# Patient Record
Sex: Male | Born: 1950 | Race: White | Hispanic: No | Marital: Married | Smoking: Never smoker
Health system: Southern US, Community
[De-identification: ages and names within clinical notes are randomized; demographics above are authoritative.]

## PROBLEM LIST (undated history)

## (undated) DIAGNOSIS — N138 Other obstructive and reflux uropathy: Secondary | ICD-10-CM

## (undated) DIAGNOSIS — I82409 Acute embolism and thrombosis of unspecified deep veins of unspecified lower extremity: Secondary | ICD-10-CM

## (undated) DIAGNOSIS — G2581 Restless legs syndrome: Secondary | ICD-10-CM

## (undated) DIAGNOSIS — K219 Gastro-esophageal reflux disease without esophagitis: Secondary | ICD-10-CM

## (undated) DIAGNOSIS — G4700 Insomnia, unspecified: Secondary | ICD-10-CM

## (undated) DIAGNOSIS — I10 Essential (primary) hypertension: Secondary | ICD-10-CM

## (undated) DIAGNOSIS — F419 Anxiety disorder, unspecified: Secondary | ICD-10-CM

## (undated) DIAGNOSIS — T8172XA Complication of vein following a procedure, not elsewhere classified, initial encounter: Secondary | ICD-10-CM

## (undated) DIAGNOSIS — E785 Hyperlipidemia, unspecified: Secondary | ICD-10-CM

## (undated) DIAGNOSIS — G4730 Sleep apnea, unspecified: Secondary | ICD-10-CM

## (undated) DIAGNOSIS — M1990 Unspecified osteoarthritis, unspecified site: Secondary | ICD-10-CM

## (undated) DIAGNOSIS — N189 Chronic kidney disease, unspecified: Secondary | ICD-10-CM

## (undated) DIAGNOSIS — N401 Enlarged prostate with lower urinary tract symptoms: Secondary | ICD-10-CM

## (undated) HISTORY — DX: Essential (primary) hypertension: I10

## (undated) HISTORY — DX: Hyperlipidemia, unspecified: E78.5

## (undated) HISTORY — PX: JOINT REPLACEMENT: SHX530

## (undated) HISTORY — PX: UMBILICAL HERNIA REPAIR: SHX196

## (undated) HISTORY — PX: CERVICAL FUSION: SHX112

---

## 1999-01-28 HISTORY — PX: HERNIA REPAIR: SHX51

## 2004-09-17 ENCOUNTER — Ambulatory Visit: Payer: Self-pay | Admitting: Gastroenterology

## 2005-08-20 ENCOUNTER — Ambulatory Visit: Payer: Self-pay | Admitting: Pain Medicine

## 2005-08-26 ENCOUNTER — Ambulatory Visit: Payer: Self-pay | Admitting: Pain Medicine

## 2005-09-11 ENCOUNTER — Ambulatory Visit: Payer: Self-pay | Admitting: Physician Assistant

## 2005-09-16 ENCOUNTER — Ambulatory Visit: Payer: Self-pay | Admitting: Pain Medicine

## 2005-10-13 ENCOUNTER — Ambulatory Visit: Payer: Self-pay | Admitting: Physician Assistant

## 2005-10-28 ENCOUNTER — Ambulatory Visit: Payer: Self-pay | Admitting: Physician Assistant

## 2006-05-25 ENCOUNTER — Ambulatory Visit: Payer: Self-pay | Admitting: Family Medicine

## 2006-05-27 ENCOUNTER — Ambulatory Visit: Payer: Self-pay | Admitting: Pain Medicine

## 2006-05-28 ENCOUNTER — Ambulatory Visit: Payer: Self-pay | Admitting: Pain Medicine

## 2006-06-04 ENCOUNTER — Ambulatory Visit: Payer: Self-pay | Admitting: Pain Medicine

## 2006-09-01 ENCOUNTER — Ambulatory Visit: Payer: Self-pay | Admitting: Neurosurgery

## 2008-06-20 ENCOUNTER — Ambulatory Visit: Payer: Self-pay | Admitting: Family Medicine

## 2009-05-29 ENCOUNTER — Ambulatory Visit: Payer: Self-pay | Admitting: Cardiovascular Disease

## 2009-05-29 DIAGNOSIS — R0602 Shortness of breath: Secondary | ICD-10-CM

## 2009-05-29 DIAGNOSIS — F341 Dysthymic disorder: Secondary | ICD-10-CM | POA: Insufficient documentation

## 2009-05-29 DIAGNOSIS — E785 Hyperlipidemia, unspecified: Secondary | ICD-10-CM

## 2010-02-27 NOTE — Progress Notes (Signed)
Summary: PHI  PHI   Imported By: Harlon Flor 05/31/2009 11:54:55  _____________________________________________________________________  External Attachment:    Type:   Image     Comment:   External Document

## 2010-02-27 NOTE — Assessment & Plan Note (Signed)
Summary: np6   Visit Type:  Initial Consult Referring Provider:  self referral Primary Provider:  Dr. Serita Grit  CC:  Patient have a history with chloresterol and never had a stress test. He is having problem with stamina and it is a concern to him. Patient visiting also to establish a new Cardiologist per his daughters that are nurses request.  History of Present Illness: Mr. Mom is a patient of Dr. 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 He states that over the past several months, he is noticed a significant decline in his ability to exert himself. He recalls several activity such as changing a tire and working on some heavy machinery when he needed help with his friends when typically he does not require any help. He describes his symptoms as her shortness of breath and difficulty getting a full breath in. He has occasional irregular heartbeats, some dizziness. His main complaint is the breathing and chest heaviness. He has been on several rounds of antibiotics for possible bronchitis and upper respiratory infection including sinusitis and ear infections. He states that the recent treatment with Levaquin seems to be helping a little bit.  he reports losing his wife last year to cancer and believes he has some depression. He feels a little better on Celexa.  he has been on a statin for 30 years.   Current Medications (verified): 1)  Etodolac 500 Mg Tabs (Etodolac) .... Take 1 Tablet By Mouth Once Daily 2)  Eq Acid Reducer 75 Mg Tabs (Ranitidine Hcl) .... Take 1 Tablet By Mouth Once Daily 3)  Lisinopril-Hydrochlorothiazide 20-25 Mg Tabs (Lisinopril-Hydrochlorothiazide) .... Take 1/2  Tablet By Mouth Once Daily 4)  Fosamax .4 Mg Tabs (Alendronate Sodium) .... Take 1 Tablet By Mouth Once Daily 5)   Simvastatin 60 Mg Tabs (Simvastatin) .... Take 1 Tablet By Mouth At Bedtime 6)  Zolpidem Tartrate 5 Mg Tabs (Zolpidem Tartrate) .... Take 1 Tablet By Mouth At Bedtime 7)  Citalopram Hydrobromide 20 Mg Tabs (Citalopram Hydrobromide) .... Take 1 Tablet By Mouth Once Daily 8)  Centrum Silver Ultra Mens  Tabs (Multiple Vitamins-Minerals) .... Take 1 Tablet By Mouth Once Daily 9)  Fish Oil 500 Mg Caps (Omega-3 Fatty Acids) .... Take 1  Tablet By Mouth Two Times A Day 10)  Zyrtec Allergy 10 Mg Tabs (Cetirizine Hcl) .... Take 1 Tablet By Mouth Once Daily As Needed 11)  [facility] Sinus & Allergy Max St 4-10 Mg Tabs (Chlorpheniramine-Phenylephrine) .... Take 1 Tablet By Mouth Once Daily As Needed  Allergies (verified): 1)  ! * Seasonal Allergies  Review of Systems       The patient complains of chest pain and dyspnea on exertion.  The patient denies fever, vision loss, decreased hearing, hoarseness, syncope, peripheral edema, prolonged cough, abdominal pain, incontinence, muscle weakness, depression, and enlarged lymph nodes.         Malaise  Vital Signs:  Patient profile:   60 year old male Height:      74 inches Weight:      235.50 pounds BMI:     30.35 Pulse rate:   73 / minute BP sitting:   116 / 68  (left arm) Cuff size:   large  Physical Exam  General:  male in no apparent distress, HEENT exam is benign, oropharynx is clear,  neck is supple with no JVP or carotid bruits, heart sounds are regular with S1-S2 and no murmurs appreciated, lungs are clear to auscultation with no wheezes Rales, abdominal exam is notable for severe obesity but otherwise benign, no significant lower extremity edema, neurologic exam is grossly nonfocal, skin is warm and dry. Pulses are equal and symmetrical in his upper and lower extremities.    EKG  Procedure date:  05/29/2009  Findings:      normal sinus rhythm with rate 73 beats per minute, right bundle branch block, no other significant ST or T wave  changes.  Impression & Recommendations:  Problem # 1:  CHEST TIGHTNESS-PRESSURE-OTHER ([id number]) Etiology of his chest discomfort, shortness of breath is uncertain. He is 60, has a right bundle branch block, no smoking history or diabetes history. He has no strong family history. He has been taking a cholesterol medication for 30 years. We have offered a stress test and  an echocardiogram to him though he would like to continue the Levaquin first, make sure his symptoms would not do to allergies, and possibly see a pulmonologist. I've asked him to contact us if his symptoms get worse as we would perform a stress test rule out ischemia as a cause of his shortness of breath, chest heaviness.

## 2010-03-20 ENCOUNTER — Ambulatory Visit (INDEPENDENT_AMBULATORY_CARE_PROVIDER_SITE_OTHER): Payer: BC Managed Care – PPO | Admitting: Cardiovascular Disease

## 2010-03-20 ENCOUNTER — Encounter: Payer: Self-pay | Admitting: Cardiovascular Disease

## 2010-03-20 DIAGNOSIS — E785 Hyperlipidemia, unspecified: Secondary | ICD-10-CM

## 2010-03-20 DIAGNOSIS — I10 Essential (primary) hypertension: Secondary | ICD-10-CM

## 2010-03-20 DIAGNOSIS — R079 Chest pain, unspecified: Secondary | ICD-10-CM

## 2010-03-20 DIAGNOSIS — R5381 Other malaise: Secondary | ICD-10-CM

## 2010-03-20 DIAGNOSIS — IMO0001 Reserved for inherently not codable concepts without codable children: Secondary | ICD-10-CM

## 2010-03-20 DIAGNOSIS — R5383 Other fatigue: Secondary | ICD-10-CM | POA: Insufficient documentation

## 2010-03-26 NOTE — Assessment & Plan Note (Signed)
Summary: ROV/BP elevated/AMD   Visit Type:  Follow-up Referring Provider:  self referral Primary Provider:  Dr. Serita Grit  CC:  c/o elevated blood pressure for the past month..  History of Present Illness: [name] is a patient of Dr. 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  He reports that he twisted his ankle in the past several months and because of that, has been less active. His weight has increased. With that, his pressure has increased. He was taking half a pill of lisinopril HCT and currently takes one full pill. He does report some systolic pressures greater than 140 periodically on average they are in the 130 range. Diastolic pressure typically in the 70s to 80s.  He does feel some fatigue, also some muscle discomfort. He has been building a cabin and has difficulty at times using a hammer. Otherwise sleeping well.  he reports losing his wife 2 years agor to cancer. he has been on a statin for 30 years.  EKG shows normal sinus rhythm with rate 86 beats per minute, right bundle branch block  Preventive Screening-Counseling & Management  Alcohol-Tobacco     Smoking Status: never  Caffeine-Diet-Exercise     Does Patient Exercise: yes  Current Medications (verified): 1)  Etodolac 500 Mg Tabs (Etodolac) .... Take 1 Tablet By Mouth Once Daily 2)  Eq Acid Reducer 75 Mg Tabs (Ranitidine Hcl) .... Take 1 Tablet By Mouth Once Daily 3)  Lisinopril-Hydrochlorothiazide 20-25 Mg Tabs (Lisinopril-Hydrochlorothiazide) .... Take 1  Tablet By Mouth Once Daily 4)  Fosamax .4 Mg Tabs (Alendronate Sodium) .... Take 1 Tablet By Mouth Once Daily 5)  Simvastatin 60 Mg Tabs (Simvastatin) .... Take 1 Tablet By Mouth At Bedtime 6)  Zolpidem Tartrate 5 Mg Tabs (Zolpidem Tartrate) .... Take 1 Tablet By Mouth At Bedtime 7)  Citalopram Hydrobromide 20 Mg Tabs (Citalopram Hydrobromide) .... Take 1 Tablet By Mouth Once  Daily 8)  Centrum Silver Ultra Mens  Tabs (Multiple Vitamins-Minerals) .... Take 1 Tablet By Mouth Once Daily 9)  Fish Oil 500 Mg Caps (Omega-3 Fatty Acids) .... Take 1  Tablet By Mouth Two Times A Day 10)  Zyrtec Allergy 10 Mg Tabs (Cetirizine Hcl) .... Take 1 Tablet By Mouth Once Daily As Needed 11)  Cvs Sinus & Allergy Max St 4-10 Mg Tabs (Chlorpheniramine-Phenylephrine) .... Take 1 Tablet By Mouth Once Daily As Needed  Allergies (verified): 1)  ! * Seasonal Allergies  Past History:  Family History: Last updated: 26-Mar-2010 Father: Deceased; lung disease Mother: Deceased; CHF  Social History: Last updated: 03/26/10 Retired  Widowed  Tobacco Use - No.  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  Walks 2-3 miles once daily  Risk Factors: Exercise: yes (March 26, 2010)  Risk Factors: Smoking Status: never (26-Mar-2010)  Past Medical History: Hyperlipidemia Hypertension  Past Surgical History: umbilical hernia repair  Family History: Father: Deceased; lung disease Mother: Deceased; CHF  Social History: Retired  Widowed  Tobacco Use - No.  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  fatigue, occasional muscle ache  Vital Signs:  Patient profile:   60 year old male Height:      74 inches Weight:      247 pounds BMI:     31.83 Pulse rate:   86 / minute BP sitting:   118 / 86  (left arm) Cuff size:   large  Vitals Entered By: Bishop Dublin, CMA (March 20, 2010 4:32 PM)  Physical Exam  General:  male in no apparent distress, HEENT exam is  benign, oropharynx is clear, neck is supple with no JVP or carotid bruits, heart sounds are regular with S1-S2 and no murmurs appreciated, lungs are clear to auscultation with no wheezes Rales, abdominal exam is notable for severe obesity but otherwise benign, no significant lower extremity edema, neurologic exam is grossly nonfocal, skin is warm and dry. Pulses are equal and symmetrical in his upper and lower extremities. Msk:  Back normal, normal gait. Muscle strength and tone normal. Neurologic:  Alert and oriented x 3. Skin:  Intact without lesions or rashes. Psych:  Normal affect.   Impression & Recommendations:  Problem # 1:  HYPERTENSION, BENIGN (ICD-401.1) we spent significant time talking about his blood pressure appeared his weight is elevated, I talked about salt and sodium, he is less active and more deconditioned because of his recent ankle injury. All of these issues could be combined for mildly elevated blood pressure. In general, it appears that it is reasonably well controlled on his current medication regimen and we have made no changes.  He did comment on having a cough and is concerned about lisinopril. We did mention that this could be changed to losartan. He would discuss this with Dr. Beckey Downing.  His updated medication list for this problem includes:    Lisinopril-hydrochlorothiazide 20-25 Mg Tabs (Lisinopril-hydrochlorothiazide) .Marland Kitchen... Take 1  tablet by mouth once daily  Problem # 2:  CHEST TIGHTNESS-PRESSURE-OTHER ([id number]) He does have occasional chest pressure that is very mild, or left side. It is somewhat atypical and otherwise he is very active. He would be low risk of underlying coronary artery disease as he has been on a statin for 30 years. We will continue to monitor him and if his symptoms persist or get worse with exertion, we could perform additional workup.  Problem # 3:  FATIGUE / MALAISE (ICD-780.79) he does have some fatigue, muscle ache and general  weakness. I think he is mildly deconditioned, has had recent weight change. I am concerned about his simvastatin and did suggest possibly he could take a statin holiday to see if his symptoms improve. If he does notice an improvement, he could be changed to Vytorin 10/20 mg daily or even Crestor 5 mg titrating up to 10 mg daily. We have samples of both if you would like to try this change.  If his symptoms persist, routine blood work could possibly include a thyroid check as well as free testosterone.  Problem # 4:  HYPERLIPIDEMIA-MIXED (ICD-272.4) he has no known coronary artery disease, some family history. He has been very aggressively treated with his cholesterol and I have complimented him on this. He is a nonsmoker. He is on aspirin.

## 2010-04-04 ENCOUNTER — Telehealth: Payer: Self-pay | Admitting: Cardiovascular Disease

## 2010-04-16 NOTE — Progress Notes (Signed)
Summary: Letter to PCD  Phone Note Call from Patient Call back at Home Phone [phone]   Caller: Self Call For: Roy Berger Summary of Call: Pt was in several weeks ago and was told by [facility] that we would send a letter to his PCD to discuss changing the pt from Simvastatin to Vytorin.  Pt walked in yesterday wanting to know the status of this.  Please advise. Initial call taken by: Harlon Flor,  April 04, 2010 8:04 AM  Follow-up for Phone Call        letter was written to Dr. Letta Kocher (last office note). He was going to take break from simva. If muscles better, would could start vytorin or crestor. Would he like [place] to make changes?     Appended Document: Letter to PCD Attempted to call pt, LMOM TCB /MES  Appended Document: Letter to PCD Spoke to pt, he states he is taking break from simva, he is going to follow up with Dr. Letta Kocher, and if he decides to make any changes, will do at this appt.

## 2010-08-15 ENCOUNTER — Encounter: Payer: Self-pay | Admitting: Cardiovascular Disease

## 2010-11-06 ENCOUNTER — Ambulatory Visit: Payer: Self-pay

## 2010-11-18 ENCOUNTER — Encounter: Payer: Self-pay | Admitting: Cardiovascular Disease

## 2010-11-25 ENCOUNTER — Encounter: Payer: Self-pay | Admitting: Cardiovascular Disease

## 2010-11-25 ENCOUNTER — Ambulatory Visit (INDEPENDENT_AMBULATORY_CARE_PROVIDER_SITE_OTHER): Payer: BC Managed Care – PPO | Admitting: Cardiovascular Disease

## 2010-11-25 VITALS — BP 120/84 | HR 88 | Ht 74.0 in | Wt 247.0 lb

## 2010-11-25 DIAGNOSIS — R0602 Shortness of breath: Secondary | ICD-10-CM

## 2010-11-25 DIAGNOSIS — R5381 Other malaise: Secondary | ICD-10-CM

## 2010-11-25 DIAGNOSIS — R5383 Other fatigue: Secondary | ICD-10-CM

## 2010-11-25 DIAGNOSIS — I10 Essential (primary) hypertension: Secondary | ICD-10-CM

## 2010-11-25 DIAGNOSIS — E785 Hyperlipidemia, unspecified: Secondary | ICD-10-CM

## 2010-11-25 MED ORDER — ALBUTEROL 90 MCG/ACT IN AERS: 2.0000 | INHALATION_SPRAY | Freq: Four times a day (QID) | RESPIRATORY_TRACT | Status: DC | PRN

## 2010-11-25 MED ORDER — FUROSEMIDE 20 MG PO TABS: 20.0000 mg | ORAL_TABLET | Freq: Two times a day (BID) | ORAL | Status: DC

## 2010-11-25 NOTE — Assessment & Plan Note (Signed)
Blood pressure is well controlled on today's visit. No changes made to the medications. 

## 2010-11-25 NOTE — Assessment & Plan Note (Signed)
Etiology of his shortness of breath is not exactly clear. 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 We have suggested he start to wear a mask. One option would be to try an albuterol inhaler to see if this helps. He reports that ulnar function tests have been discussed at some point in the past but have not been ordered yet.   His weight is up and his conditioning is down. This certainly could be contributing to his shortness of breath, particularly with climbing hills.He reported being short of breath on the treadmill, adding to the thought that some of his symptoms could be from conditioning.   The stress echo hopefully his definitive in ruling out ischemia though his symptoms get worse, options would include a Myoview or cardiac catheterization.  The echocardiogram did suggest elevated right-sided pressures, And mild pulmonary hypertension by the pressure measurements. He certainly may have diastolic dysfunction. We will give him a prescription for Lasix 20 mg to try as needed. We have talked to him about his diet and minimizing his fluid intake as well as salt intake.

## 2010-11-25 NOTE — Assessment & Plan Note (Signed)
We have suggested he stay on the Lipitor

## 2010-11-25 NOTE — Patient Instructions (Signed)
  Try furosemide /Lasix as needed for shortness of breath (with a banana) (diuretic), you can take up to two in the AM  Work on the weight and conditioning Try the inhaler for shortness of breath Monitor your heart rate with exertion.  If you continue to have worsening shortness of breath, call the office  Please call us if you have new issues that need to be addressed before your next appt.  The office will contact you for a follow up Appt. In 6 months

## 2010-11-25 NOTE — Progress Notes (Signed)
Patient ID: Roy Berger, male    DOB: 25-Jun-1950, 60 y.o.   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 He had a recent echocardiogram stress test. He exercised for less than 4 minutes, achieved an adequate heart rate with exaggerated blood pressure response to exercise with no significant EKG changes concerning for ischemia, normal LV function with no wall motion abnormalities with exertion, echocardiogram raising the concern of dilated right ventricle and elevated right ventricular systolic pressures (RVSP 43 mm Hg).  He continues to have symptoms of shortness of breath. He is uncertain if this is due to general deconditioning, sedentary lifestyle, weight gain, or something else.  He does eat and drink what he likes and his diet has not been very closely watched. He has cut his lisinopril HCT in half though sometimes does take the other half in the afternoon.   he reports losing his wife 2 1/2 years agor to cancer. he has been on a statin for 30 years.   Old EKG shows normal sinus rhythm with rate 86 beats per minute, right bundle branch block     Outpatient Encounter Prescriptions as of 11/25/2010  Medication Sig Dispense Refill  . Alendronate Sodium (FOSAMAX PO) Take 1 tablet by mouth daily. .4 mg       . atorvastatin (LIPITOR) 40 MG tablet Take 40 mg by mouth daily.        . Chlorpheniramine-Phenylephrine ([facility] SINUS & ALLERGY MAX ST) 4-10 MG per tablet Take 1 tablet by mouth daily as needed.        . citalopram (CELEXA) 20 MG tablet Take 20 mg by mouth daily.        Marland Kitchen lisinopril-hydrochlorothiazide (PRINZIDE,ZESTORETIC) 20-25 MG per tablet Take 1/2 tablet daily.      . Multiple Vitamins-Minerals (CENTRUM SILVER ULTRA MENS) TABS Take 1 tablet by mouth daily.        . Omega-3 Fatty Acids (FISH OIL) 500 MG CAPS Take 1 capsule by mouth 2 (two) times daily.         Marland Kitchen omeprazole (PRILOSEC) 20 MG capsule Take 20 mg by mouth daily.        Marland Kitchen zolpidem (AMBIEN) 5 MG tablet Take 5 mg by mouth daily as needed.        . etodolac (LODINE) 500 MG tablet Take 500 mg by mouth daily.          Review of Systems  Constitutional: Negative.   HENT: Negative.   Eyes: Negative.   Respiratory: Positive for shortness of breath.   Cardiovascular: Negative.   Gastrointestinal: Negative.   Musculoskeletal: Negative.   Skin: Negative.   Neurological: Negative.   Hematological: Negative.   Psychiatric/Behavioral: Negative.   All other systems reviewed and are negative.    BP 120/84  Pulse 88  Ht 6\' 2"  (1.88 m)  Wt 247 lb (112.038 kg)  BMI 31.71 kg/m2   Physical Exam  Nursing note and vitals reviewed. Constitutional: He is oriented to person, place, and time. He appears well-developed and well-nourished.  HENT:  Head: Normocephalic.  Nose: Nose normal.  Mouth/Throat: Oropharynx is clear and moist.  Eyes: Conjunctivae are normal. Pupils are equal, round, and reactive to light.  Neck: Normal range of motion. Neck supple. No JVD present.  Cardiovascular: Normal rate, regular rhythm, S1 normal, S2 normal, normal heart sounds and intact  distal pulses.  Exam reveals no gallop and no friction rub.   No murmur heard. Pulmonary/Chest: Effort normal and breath sounds normal. No respiratory distress. He has no wheezes. He has no rales. He exhibits no tenderness.  Abdominal: Soft. Bowel sounds are normal. He exhibits no distension. There is no tenderness.  Musculoskeletal: Normal range of motion. He exhibits no edema and no tenderness.  Lymphadenopathy:    He has no cervical adenopathy.  Neurological: He is alert and oriented to person, place, and time. Coordination normal.  Skin: Skin is warm and dry. No rash noted. No erythema.  Psychiatric: He has a normal mood and affect. His behavior is normal. Judgment and thought content normal.           Assessment and  Plan

## 2010-11-25 NOTE — Assessment & Plan Note (Signed)
He does have fatigue. Recent diagnosis of obstructive sleep apnea. He is not done part 2 of the sleep study.

## 2010-12-11 ENCOUNTER — Ambulatory Visit: Payer: Self-pay | Admitting: Internal Medicine

## 2011-10-28 ENCOUNTER — Ambulatory Visit: Payer: Self-pay | Admitting: Neurological Surgery

## 2014-06-25 ENCOUNTER — Ambulatory Visit
Admission: EM | Admit: 2014-06-25 | Discharge: 2014-06-25 | Disposition: A | Discharge status: Home / Self Care | Payer: Medicare HMO | Attending: Family Medicine | Admitting: Family Medicine

## 2014-06-25 ENCOUNTER — Encounter: Payer: Self-pay | Admitting: *Deleted

## 2014-06-25 DIAGNOSIS — S0501XA Injury of conjunctiva and corneal abrasion without foreign body, right eye, initial encounter: Secondary | ICD-10-CM | POA: Diagnosis not present

## 2014-06-25 DIAGNOSIS — T1591XA Foreign body on external eye, part unspecified, right eye, initial encounter: Secondary | ICD-10-CM

## 2014-06-25 HISTORY — DX: Unspecified osteoarthritis, unspecified site: M19.90

## 2014-06-25 MED ORDER — MOXIFLOXACIN HCL 0.5 % OP SOLN: 1.0000 [drp] | Freq: Three times a day (TID) | OPHTHALMIC | Status: DC

## 2014-06-25 NOTE — ED Notes (Signed)
Working in yard Friday and noticed irritation in right eye.  Continues today feels like something is in his right eye.

## 2014-06-25 NOTE — ED Provider Notes (Signed)
CSN: [id number]     Arrival date & time 06/25/14  0906 History   First MD Initiated Contact with Patient 06/25/14 1026     Chief Complaint  Patient presents with  . Eye Pain   (Consider location/radiation/quality/duration/timing/severity/associated sxs/prior Treatment) HPI Comments: 64 yo with a complaint of "feeling like there's something under my right eyelid" since Friday. States had been working in the yard the day prior and not sure if he got something in the eye.   Patient is a 64 y.o. male presenting with eye pain and eye problem. The history is provided by the patient.  Eye Pain Pertinent negatives include no headaches.  Eye Problem Location:  R eye Quality:  Foreign body sensation Onset quality:  Sudden Duration:  3 days Timing:  Constant Progression:  Waxing and waning Chronicity:  New Context: foreign body   Foreign body:  Unknown Relieved by:  Commercial eye wash and eye drops (slightly) Associated symptoms: foreign body sensation and tearing   Associated symptoms: no blurred vision, no decreased vision, no discharge, no double vision, no headaches, no nausea, no numbness, no photophobia, no scotomas, no swelling, no tingling, no vomiting and no weakness     Past Medical History  Diagnosis Date  . Arthritis    Past Surgical History  Procedure Laterality Date  . Hernia repair     Family History  Problem Relation Age of Onset  . Rheum arthritis Sister    History  Substance Use Topics  . Smoking status: Never Smoker   . Smokeless tobacco: Never Used  . Alcohol Use: Yes    Review of Systems  Eyes: Positive for pain. Negative for blurred vision, double vision, photophobia and discharge.  Gastrointestinal: Negative for nausea and vomiting.  Neurological: Negative for tingling, weakness, numbness and headaches.    Allergies  Review of patient's allergies indicates no known allergies.  Home Medications   Prior to Admission medications   Medication Sig  Start Date End Date Taking? Authorizing Provider  atorvastatin (LIPITOR) 40 MG tablet Take 40 mg by mouth daily.   Yes Historical Provider, MD  citalopram (CELEXA) 20 MG tablet Take 20 mg by mouth daily.   Yes Historical Provider, MD  meloxicam (MOBIC) 15 MG tablet Take 15 mg by mouth daily.   Yes Historical Provider, MD  omeprazole (PRILOSEC) 20 MG capsule Take 20 mg by mouth daily.   Yes Historical Provider, MD  tamsulosin (FLOMAX) 0.4 MG CAPS capsule Take 0.4 mg by mouth.   Yes Historical Provider, MD  moxifloxacin (VIGAMOX) 0.5 % ophthalmic solution Place 1 drop into the right eye 3 (three) times daily. 06/25/14   Norval Gable, MD   Ht 6\' 2"  (1.88 m)  Wt 250 lb (113.399 kg)  BMI 32.08 kg/m2 Physical Exam  Constitutional: He appears well-developed and well-nourished. No distress.  Eyes: EOM and lids are normal. Pupils are equal, round, and reactive to light. Lids are everted and swept, no foreign bodies found. Right conjunctiva is injected (slightly injected).  Slit lamp exam:      The right eye shows corneal abrasion, foreign body (pinpoint, speck) and fluorescein uptake.    Corneal abrasion noted at about 6 o'clock position outside of iris; and pinpoint foreign body speck noted at about the 3 o'clock position just outside the pupil area, but within the iris area (unable to remove with cotton tip swab wet with normal saline)  Skin: He is not diaphoretic.  Nursing note and vitals reviewed.   ED Course  Procedures (including critical care time) Labs Review Labs Reviewed - No data to display  Imaging Review No results found.   MDM   1. Corneal abrasion, right, initial encounter   2. Foreign body of right eye, initial encounter    Discharge Medication List as of 06/25/2014 10:51 AM    START taking these medications   Details  moxifloxacin (VIGAMOX) 0.5 % ophthalmic solution Place 1 drop into the right eye 3 (three) times daily., Starting 06/25/2014, Until Discontinued, Normal        Plan: 1. Diagnosis reviewed with patient 2. rx as per orders; risks, benefits, potential side effects reviewed with patient 3. Recommend follow up with ophthalmologist for further evaluation and management 4. F/u prn if symptoms worsen or don't improve    Norval Gable, MD 06/27/14 [phone]

## 2014-06-25 NOTE — Discharge Instructions (Signed)
Corneal Abrasion °The cornea is the clear covering at the front and center of the eye. When looking at the colored portion of the eye (iris), you are looking through the cornea. This very thin tissue is made up of many layers. The surface layer is a single layer of cells (corneal epithelium) and is one of the most sensitive tissues in the body. If a scratch or injury causes the corneal epithelium to come off, it is called a corneal abrasion. If the injury extends to the tissues below the epithelium, the condition is called a corneal ulcer. °CAUSES  °· Scratches. °· Trauma. °· Foreign body in the eye. °Some people have recurrences of abrasions in the area of the original injury even after it has healed (recurrent erosion syndrome). Recurrent erosion syndrome generally improves and goes away with time. °SYMPTOMS  °· Eye pain. °· Difficulty or inability to keep the injured eye open. °· The eye becomes very sensitive to light. °· Recurrent erosions tend to happen suddenly, first thing in the morning, usually after waking up and opening the eye. °DIAGNOSIS  °Your health care provider can diagnose a corneal abrasion during an eye exam. Dye is usually placed in the eye using a drop or a small paper strip moistened by your tears. When the eye is examined with a special light, the abrasion shows up clearly because of the dye. °TREATMENT  °· Small abrasions may be treated with antibiotic drops or ointment alone. °· A pressure patch may be put over the eye. If this is done, follow your doctor's instructions for when to remove the patch. Do not drive or use machines while the eye patch is on. Judging distances is hard to do with a patch on. °If the abrasion becomes infected and spreads to the deeper tissues of the cornea, a corneal ulcer can result. This is serious because it can cause corneal scarring. Corneal scars interfere with light passing through the cornea and cause a loss of vision in the involved eye. °HOME CARE  INSTRUCTIONS °· Use medicine or ointment as directed. Only take over-the-counter or prescription medicines for pain, discomfort, or fever as directed by your health care provider. °· Do not drive or operate machinery if your eye is patched. Your ability to judge distances is impaired. °· If your health care provider has given you a follow-up appointment, it is very important to keep that appointment. Not keeping the appointment could result in a severe eye infection or permanent loss of vision. If there is any problem keeping the appointment, let your health care provider know. °SEEK MEDICAL CARE IF:  °· You have pain, light sensitivity, and a scratchy feeling in one eye or both eyes. °· Your pressure patch keeps loosening up, and you can blink your eye under the patch after treatment. °· Any kind of discharge develops from the eye after treatment or if the lids stick together in the morning. °· You have the same symptoms in the morning as you did with the original abrasion days, weeks, or months after the abrasion healed. °MAKE SURE YOU:  °· Understand these instructions. °· Will watch your condition. °· Will get help right away if you are not doing well or get worse. °Document Released: 01/11/2000 Document Revised: 01/18/2013 Document Reviewed: 09/20/2012 °[employer]® Patient Information ©2015 [employer]. This information is not intended to replace advice given to you by your health care provider. Make sure you discuss any questions you have with your health care provider. ° °

## 2014-06-27 ENCOUNTER — Encounter: Payer: Self-pay | Admitting: Cardiovascular Disease

## 2014-07-07 ENCOUNTER — Encounter: Payer: Self-pay | Admitting: Gynecology

## 2014-07-07 ENCOUNTER — Ambulatory Visit
Admission: EM | Admit: 2014-07-07 | Discharge: 2014-07-07 | Disposition: A | Discharge status: Home / Self Care | Payer: Medicare HMO | Attending: Family Medicine | Admitting: Family Medicine

## 2014-07-07 DIAGNOSIS — W57XXXA Bitten or stung by nonvenomous insect and other nonvenomous arthropods, initial encounter: Secondary | ICD-10-CM | POA: Diagnosis not present

## 2014-07-07 DIAGNOSIS — L03114 Cellulitis of left upper limb: Secondary | ICD-10-CM

## 2014-07-07 DIAGNOSIS — S40862A Insect bite (nonvenomous) of left upper arm, initial encounter: Secondary | ICD-10-CM

## 2014-07-07 MED ORDER — CEPHALEXIN 500 MG PO CAPS: 500.0000 mg | ORAL_CAPSULE | Freq: Two times a day (BID) | ORAL | Status: DC

## 2014-07-07 MED ORDER — MUPIROCIN CALCIUM 2 % EX CREA: 1.0000 "application " | TOPICAL_CREAM | Freq: Three times a day (TID) | CUTANEOUS | Status: DC

## 2014-07-07 NOTE — ED Notes (Signed)
Patient c/o ? Insect bite x 3 days. Per pt. Redness at area and itching.

## 2014-07-07 NOTE — Discharge Instructions (Signed)

## 2014-07-07 NOTE — ED Provider Notes (Signed)
CSN: [id number]     Arrival date & time 07/07/14  1701 History   First MD Initiated Contact with Patient 07/07/14 1740     Chief Complaint  Patient presents with  . Insect Bite   (Consider location/radiation/quality/duration/timing/severity/associated sxs/prior Treatment) HPI  64 y o M noted irritation of left forearm yesterday , presumed to be an insect bite - this morning had a halo of pink skin surrounding it and 2 small white blisters centrally .- he decided to open blisters with a needle but nothing  released. Arm is increasing warmth and irritation and he reports in. Denies allergies. Denies any symptoms except local irritation. Insect not witnesses-he suspects "spider or wasp"- no tick noted Past Medical History  Diagnosis Date  . HTN (hypertension)   . HLD (hyperlipidemia)   . Arthritis    Past Surgical History  Procedure Laterality Date  . Umbilical hernia repair    . Hernia repair     Family History  Problem Relation Age of Onset  . Rheum arthritis Sister    History  Substance Use Topics  . Smoking status: Never Smoker   . Smokeless tobacco: Never Used     Comment: tobacco use - no  . Alcohol Use: Yes     Comment: rare    Review of Systems Review of 10 systems negative for acute change except as referenced in HPI  Allergies  Review of patient's allergies indicates no known allergies.  Home Medications   Prior to Admission medications   Medication Sig Start Date End Date Taking? Authorizing Provider  Alendronate Sodium (FOSAMAX PO) Take 1 tablet by mouth daily. .4 mg    Yes Historical Provider, MD  atorvastatin (LIPITOR) 40 MG tablet Take 40 mg by mouth daily.     Yes Historical Provider, MD  atorvastatin (LIPITOR) 40 MG tablet Take 40 mg by mouth daily.   Yes Historical Provider, MD  Chlorpheniramine-Phenylephrine ([facility]) 4-10 MG per tablet Take 1 tablet by mouth daily as needed.     Yes Historical Provider, MD  citalopram (CELEXA) 20 MG  tablet Take 20 mg by mouth daily.     Yes Historical Provider, MD  citalopram (CELEXA) 20 MG tablet Take 20 mg by mouth daily.   Yes Historical Provider, MD  etodolac (LODINE) 500 MG tablet Take 500 mg by mouth daily.     Yes Historical Provider, MD  furosemide (LASIX) 20 MG tablet Take 1 tablet (20 mg total) by mouth 2 (two) times daily. 11/25/10 07/07/14 Yes Minna Merritts, MD  lisinopril-hydrochlorothiazide (PRINZIDE,ZESTORETIC) 20-25 MG per tablet Take 1/2 tablet daily.   Yes Historical Provider, MD  meloxicam (MOBIC) 15 MG tablet Take 15 mg by mouth daily.   Yes Historical Provider, MD  moxifloxacin (VIGAMOX) 0.5 % ophthalmic solution Place 1 drop into the right eye 3 (three) times daily. 06/25/14  Yes Norval Gable, MD  Multiple Vitamins-Minerals (CENTRUM SILVER ULTRA MENS) TABS Take 1 tablet by mouth daily.     Yes Historical Provider, MD  Omega-3 Fatty Acids (FISH OIL) 500 MG CAPS Take 1 capsule by mouth 2 (two) times daily.     Yes Historical Provider, MD  omeprazole (PRILOSEC) 20 MG capsule Take 20 mg by mouth daily.     Yes Historical Provider, MD  omeprazole (PRILOSEC) 20 MG capsule Take 20 mg by mouth daily.   Yes Historical Provider, MD  tamsulosin (FLOMAX) 0.4 MG CAPS capsule Take 0.4 mg by mouth.   Yes Historical  Provider, MD  albuterol (PROVENTIL,VENTOLIN) 90 MCG/ACT inhaler Inhale 2 puffs into the lungs every 6 (six) hours as needed for wheezing. 11/25/10 11/20/11  Minna Merritts, MD  cephALEXin (KEFLEX) 500 MG capsule Take 1 capsule (500 mg total) by mouth 2 (two) times daily. 07/07/14   Jan Fireman, PA-C  mupirocin cream (BACTROBAN) 2 % Apply 1 application topically 3 (three) times daily. Please substitute ointment ! 07/07/14   Jan Fireman, PA-C  zolpidem (AMBIEN) 5 MG tablet Take 5 mg by mouth daily as needed.      Historical Provider, MD   BP 122/75 mmHg  Pulse 84  Temp(Src) 98.6 F (37 C) (Oral)  Ht 6\' 2"  (1.88 m)  Wt 250 lb (113.399 kg)  BMI 32.08 kg/m2  SpO2  95% Physical Exam Constitutional -alert and oriented,well appearing and in no acute distress Head-atraumatic Eyes- cconjugate gaze Nose- no congestion or rhinorrhea Mouth/throat- mucous membranes moist , Neck- supple  CV- regular rate, grossly normal heart sounds,  Resp-no distress, normal respiratory effort,clear to auscultation bilaterally GI- ,no distention GU- not examined  Left upper extremity with 14 x16 cm erythema and central 3-4 mm wound compatible with report of probable insect bite- good pulses , no finger swelling - skin edges marked. No other extremity tenderness nor edema,no joint effusion,  LEs WNL Neuro- normal speech and language, no gross focal neurological deficit appreciated, no gait instability Skin-warm,dry ,intact;extensive sunburn face and arms - reports wearing sunscreen  Psych-mood and affect grossly normal; speech and behavior grossly normal ED Course  Procedures (including critical care time) Labs Review Labs Reviewed - No data to display  Imaging Review No results found.   Area of inflammation 14 x 16 cm was skin marked for patient. To report back if extends further or if without improvement in 24-48 hours.     MDM   1. Insect bite of arm, left, initial encounter   2. Cellulitis of left upper extremity     Plan: 1.  diagnosis reviewed with patient 2. rx as per orders; risks, benefits, potential side effects reviewed with patient 3. Recommend supportive treatment with Zantac 150 mg  And Zyrtec 10 mg BID x 3-5 days 4. Ice paks with skin protection prn 5. F/u if symptoms worsen or don't improve  Discharge Medication List as of 07/07/2014  6:13 PM    START taking these medications   Details  cephALEXin (KEFLEX) 500 MG capsule Take 1 capsule (500 mg total) by mouth 2 (two) times daily., Starting 07/07/2014, Until Discontinued, Normal    mupirocin cream (BACTROBAN) 2 % Apply 1 application topically 3 (three) times daily. Please substitute ointment  !, Starting 07/07/2014, Until Discontinued, Normal          Jan Fireman, PA-C 07/07/14 1828

## 2014-10-17 ENCOUNTER — Other Ambulatory Visit: Payer: Self-pay | Admitting: Specialist

## 2014-10-17 DIAGNOSIS — S83232D Complex tear of medial meniscus, current injury, left knee, subsequent encounter: Secondary | ICD-10-CM

## 2014-10-26 ENCOUNTER — Ambulatory Visit
Admission: RE | Admit: 2014-10-26 | Discharge: 2014-10-26 | Disposition: A | Discharge status: Home / Self Care | Payer: Medicare HMO | Source: Ambulatory Visit | Attending: Specialist | Admitting: Specialist

## 2014-10-26 DIAGNOSIS — X58XXXD Exposure to other specified factors, subsequent encounter: Secondary | ICD-10-CM | POA: Insufficient documentation

## 2014-10-26 DIAGNOSIS — S83232D Complex tear of medial meniscus, current injury, left knee, subsequent encounter: Secondary | ICD-10-CM

## 2014-11-06 ENCOUNTER — Ambulatory Visit: Payer: Medicare HMO | Admitting: Cardiovascular Disease

## 2014-11-30 ENCOUNTER — Encounter: Payer: Self-pay | Admitting: Cardiovascular Disease

## 2014-11-30 ENCOUNTER — Encounter (INDEPENDENT_AMBULATORY_CARE_PROVIDER_SITE_OTHER): Payer: Self-pay

## 2014-11-30 ENCOUNTER — Ambulatory Visit (INDEPENDENT_AMBULATORY_CARE_PROVIDER_SITE_OTHER): Payer: Medicare HMO | Admitting: Cardiovascular Disease

## 2014-11-30 VITALS — BP 130/82 | HR 55 | Ht 74.0 in | Wt 254.8 lb

## 2014-11-30 DIAGNOSIS — Z23 Encounter for immunization: Secondary | ICD-10-CM

## 2014-11-30 DIAGNOSIS — R0789 Other chest pain: Secondary | ICD-10-CM | POA: Diagnosis not present

## 2014-11-30 DIAGNOSIS — E785 Hyperlipidemia, unspecified: Secondary | ICD-10-CM | POA: Diagnosis not present

## 2014-11-30 DIAGNOSIS — F341 Dysthymic disorder: Secondary | ICD-10-CM

## 2014-11-30 DIAGNOSIS — R5382 Chronic fatigue, unspecified: Secondary | ICD-10-CM

## 2014-11-30 DIAGNOSIS — I10 Essential (primary) hypertension: Secondary | ICD-10-CM | POA: Diagnosis not present

## 2014-11-30 DIAGNOSIS — R0602 Shortness of breath: Secondary | ICD-10-CM

## 2014-11-30 NOTE — Assessment & Plan Note (Signed)
Mild shortness of breath on exertion likely from obesity, deconditioning. Recent normal stress test with poor exercise tolerance

## 2014-11-30 NOTE — Assessment & Plan Note (Signed)
Chronic fatigue, again recommended walking program. Unable to exclude sleep disorder

## 2014-11-30 NOTE — Addendum Note (Signed)
Addended by: Minna Merritts on: 11/30/2014 09:13 PM   Modules accepted: Level of Service

## 2014-11-30 NOTE — Assessment & Plan Note (Signed)
Seems to have significant anxiety concerning the election, pushing up his blood pressure and heart rate Recommended a regular walking program

## 2014-11-30 NOTE — Patient Instructions (Signed)
You are doing well. No medication changes were made.  Please call us if you have new issues that need to be addressed before your next appt.  Your physician wants you to follow-up in: 12 months.  You will receive a reminder letter in the mail two months in advance. If you don't receive a letter, please call our office to schedule the follow-up appointment. 

## 2014-11-30 NOTE — Assessment & Plan Note (Signed)
Unclear if his having some myalgias but does report some weakness in his shoulders If needed, he could hold the Lipitor for several weeks to see if symptoms improve

## 2014-11-30 NOTE — Assessment & Plan Note (Signed)
Blood pressure seems to be much better on current medication Suspect some labile component, possibly from anxiety He reports significant stress concerning the election

## 2014-11-30 NOTE — Progress Notes (Signed)
Patient ID: Roy Berger, male    DOB: March 20, 1950, 64 y.o.   MRN: [id number]  HPI Comments: 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 He takes both in the morning Denies any orthopnea symptoms Heart rate also up and down He denies any significant chest pain concerning for angina He does have shortness of breath with heavy exertion  Recent echo stress test every 2016, only exercised for approximately 3 minutes with heart rate 144 bpm Was reportedly normal Weight has increased from 247 pounds up to 254 pounds since his last clinic visit  Prior lab work reviewed with him, total cholesterol 177, LDL 98  EKG on today's visit shows normal sinus rhythm with rate 55 bpm, right bundle branch block  Other past medical history Prior echocardiogram stress test. He exercised for less than 4 minutes, achieved an adequate heart rate with exaggerated blood pressure response to exercise with no significant EKG changes concerning for ischemia, normal LV function with no wall motion abnormalities with exertion, echocardiogram raising the concern of dilated right ventricle and elevated right ventricular systolic pressures (RVSP 43 mm Hg).  he reports losing his wife  several years ago to cancer. he has been on a statin for >30 years.    No Known Allergies  Current Outpatient Prescriptions on File Prior to Visit  Medication Sig Dispense Refill  . atorvastatin (LIPITOR) 40 MG tablet Take 40 mg by mouth daily.      . cephALEXin (KEFLEX) 500 MG capsule Take 1 capsule (500 mg total) by mouth 2 (two) times daily. 20 capsule  0  . citalopram (CELEXA) 20 MG tablet Take 20 mg by mouth daily.    . meloxicam (MOBIC) 15 MG tablet Take 15 mg by mouth daily.    . Multiple Vitamins-Minerals (CENTRUM SILVER ULTRA MENS) TABS Take 1 tablet by mouth daily.      Marland Kitchen omeprazole (PRILOSEC) 20 MG capsule Take 20 mg by mouth daily.      . tamsulosin (FLOMAX) 0.4 MG CAPS capsule Take 0.4 mg by mouth.    . zolpidem (AMBIEN) 5 MG tablet Take 5 mg by mouth daily as needed.       No current facility-administered medications on file prior to visit.    Past Medical History  Diagnosis Date  . HTN (hypertension)   . HLD (hyperlipidemia)   . Arthritis     Past Surgical History  Procedure Laterality Date  . Umbilical hernia repair    . Hernia repair      Social History  reports that he has never smoked. He has never used smokeless tobacco. He reports that he drinks alcohol. He reports that he does not use illicit drugs.  Family History family history includes Rheum arthritis in his sister.  Review of Systems  Constitutional: Positive for fatigue.  Respiratory: Positive for shortness of breath.   Cardiovascular: Negative.   Gastrointestinal: Negative.   Musculoskeletal: Negative.   Neurological: Negative.   Psychiatric/Behavioral: Negative.   All other systems reviewed and are negative.   BP 130/82 mmHg  Pulse 55  Ht 6\' 2"  (1.88 m)  Wt  254 lb 12 oz (115.554 kg)  BMI 32.69 kg/m2   Physical Exam  Constitutional: He is oriented to person, place, and time. He appears well-developed and well-nourished.  HENT:  Head: Normocephalic.  Nose: Nose normal.  Mouth/Throat: Oropharynx is clear and moist.  Eyes: Conjunctivae are normal. Pupils are equal, round, and reactive to light.  Neck: Normal range of motion. Neck supple. No JVD present.  Cardiovascular: Normal rate, regular rhythm, S1 normal, S2 normal, normal heart sounds and intact distal pulses.  Exam reveals no gallop and no friction rub.   No murmur  heard. Pulmonary/Chest: Effort normal and breath sounds normal. No respiratory distress. He has no wheezes. He has no rales. He exhibits no tenderness.  Abdominal: Soft. Bowel sounds are normal. He exhibits no distension. There is no tenderness.  Musculoskeletal: Normal range of motion. He exhibits no edema or tenderness.  Lymphadenopathy:    He has no cervical adenopathy.  Neurological: He is alert and oriented to person, place, and time. Coordination normal.  Skin: Skin is warm and dry. No rash noted. No erythema.  Psychiatric: He has a normal mood and affect. His behavior is normal. Judgment and thought content normal.      Assessment and Plan   Nursing note and vitals reviewed.

## 2015-04-06 ENCOUNTER — Encounter: Payer: Self-pay | Admitting: *Deleted

## 2015-04-09 ENCOUNTER — Ambulatory Visit: Payer: Medicare HMO | Admitting: Anesthesiology

## 2015-04-09 ENCOUNTER — Encounter
Admission: RE | Disposition: A | Discharge status: Home / Self Care | Payer: Self-pay | Source: Ambulatory Visit | Attending: Gastroenterology

## 2015-04-09 ENCOUNTER — Encounter: Payer: Self-pay | Admitting: *Deleted

## 2015-04-09 ENCOUNTER — Ambulatory Visit
Admission: RE | Admit: 2015-04-09 | Discharge: 2015-04-09 | Disposition: A | Discharge status: Home / Self Care | Payer: Medicare HMO | Source: Ambulatory Visit | Attending: Gastroenterology | Admitting: Gastroenterology

## 2015-04-09 DIAGNOSIS — Z7982 Long term (current) use of aspirin: Secondary | ICD-10-CM | POA: Insufficient documentation

## 2015-04-09 DIAGNOSIS — K219 Gastro-esophageal reflux disease without esophagitis: Secondary | ICD-10-CM | POA: Diagnosis not present

## 2015-04-09 DIAGNOSIS — M1990 Unspecified osteoarthritis, unspecified site: Secondary | ICD-10-CM | POA: Diagnosis not present

## 2015-04-09 DIAGNOSIS — N400 Enlarged prostate without lower urinary tract symptoms: Secondary | ICD-10-CM | POA: Diagnosis not present

## 2015-04-09 DIAGNOSIS — K644 Residual hemorrhoidal skin tags: Secondary | ICD-10-CM | POA: Diagnosis not present

## 2015-04-09 DIAGNOSIS — E785 Hyperlipidemia, unspecified: Secondary | ICD-10-CM | POA: Insufficient documentation

## 2015-04-09 DIAGNOSIS — F329 Major depressive disorder, single episode, unspecified: Secondary | ICD-10-CM | POA: Insufficient documentation

## 2015-04-09 DIAGNOSIS — G4730 Sleep apnea, unspecified: Secondary | ICD-10-CM | POA: Insufficient documentation

## 2015-04-09 DIAGNOSIS — I10 Essential (primary) hypertension: Secondary | ICD-10-CM | POA: Insufficient documentation

## 2015-04-09 DIAGNOSIS — D122 Benign neoplasm of ascending colon: Secondary | ICD-10-CM | POA: Insufficient documentation

## 2015-04-09 DIAGNOSIS — Z79899 Other long term (current) drug therapy: Secondary | ICD-10-CM | POA: Insufficient documentation

## 2015-04-09 DIAGNOSIS — Z1211 Encounter for screening for malignant neoplasm of colon: Secondary | ICD-10-CM | POA: Diagnosis present

## 2015-04-09 DIAGNOSIS — K5730 Diverticulosis of large intestine without perforation or abscess without bleeding: Secondary | ICD-10-CM | POA: Diagnosis not present

## 2015-04-09 HISTORY — DX: Benign prostatic hyperplasia with lower urinary tract symptoms: N40.1

## 2015-04-09 HISTORY — PX: COLONOSCOPY WITH PROPOFOL: SHX5780

## 2015-04-09 HISTORY — DX: Gastro-esophageal reflux disease without esophagitis: K21.9

## 2015-04-09 HISTORY — DX: Sleep apnea, unspecified: G47.30

## 2015-04-09 HISTORY — DX: Insomnia, unspecified: G47.00

## 2015-04-09 HISTORY — DX: Other obstructive and reflux uropathy: N13.8

## 2015-04-09 SURGERY — COLONOSCOPY WITH PROPOFOL
Anesthesia: General

## 2015-04-09 MED ORDER — SODIUM CHLORIDE 0.9 % IV SOLN
INTRAVENOUS | Status: DC
  Administered 2015-04-09: 09:00:00 via INTRAVENOUS

## 2015-04-09 MED ORDER — MIDAZOLAM HCL 5 MG/5ML IJ SOLN
INTRAMUSCULAR | Status: DC | PRN
  Administered 2015-04-09: 1 mg via INTRAVENOUS

## 2015-04-09 MED ORDER — EPINEPHRINE HCL 0.1 MG/ML IJ SOSY
PREFILLED_SYRINGE | INTRAMUSCULAR | Status: DC | PRN
  Administered 2015-04-09 (×4): 10 ug via INTRAVENOUS

## 2015-04-09 MED ORDER — PROPOFOL 10 MG/ML IV BOLUS
INTRAVENOUS | Status: DC | PRN
  Administered 2015-04-09: 100 mg via INTRAVENOUS

## 2015-04-09 MED ORDER — LIDOCAINE HCL (CARDIAC) 20 MG/ML IV SOLN
INTRAVENOUS | Status: DC | PRN
Start: 2015-04-09 — End: 2015-04-09
  Administered 2015-04-09: 100 mg via INTRAVENOUS

## 2015-04-09 MED ORDER — EPHEDRINE SULFATE 50 MG/ML IJ SOLN
INTRAMUSCULAR | Status: DC | PRN
  Administered 2015-04-09: 10 mg via INTRAVENOUS

## 2015-04-09 MED ORDER — PROPOFOL 500 MG/50ML IV EMUL
INTRAVENOUS | Status: DC | PRN
  Administered 2015-04-09: 140 ug/kg/min via INTRAVENOUS

## 2015-04-09 MED ORDER — FENTANYL CITRATE (PF) 100 MCG/2ML IJ SOLN
INTRAMUSCULAR | Status: DC | PRN
  Administered 2015-04-09: 50 ug via INTRAVENOUS

## 2015-04-09 NOTE — Transfer of Care (Signed)
Immediate Anesthesia Transfer of Care Note  Patient: Roy Berger  Procedure(s) Performed: Procedure(s): COLONOSCOPY WITH PROPOFOL (N/A)  Patient Location: PACU  Anesthesia Type:General  Level of Consciousness: awake and patient cooperative  Airway & Oxygen Therapy: Patient Spontanous Breathing and Patient connected to nasal cannula oxygen  Post-op Assessment: Report given to RN and Post -op Vital signs reviewed and stable  Post vital signs: Reviewed and stable  Last Vitals:  Filed Vitals:   04/09/15 1043 04/09/15 1044  BP: 91/49 91/49  Pulse: 57 62  Temp: 36 C 36 C  Resp: 19 16    Complications: No apparent anesthesia complications

## 2015-04-09 NOTE — H&P (Signed)
Primary Care Physician:  Adin Hector, MD  Pre-Procedure History & Physical: HPI:  Roy Berger is a 65 y.o. male is here for an colonoscopy.   Past Medical History  Diagnosis Date  . HTN (hypertension)   . HLD (hyperlipidemia)   . Arthritis   . GERD (gastroesophageal reflux disease)   . Insomnia   . Benign prostatic hyperplasia with urinary obstruction and other lower urinary tract symptoms   . Sleep apnea     Past Surgical History  Procedure Laterality Date  . Umbilical hernia repair    . Hernia repair      Prior to Admission medications   Medication Sig Start Date End Date Taking? Authorizing Provider  aspirin (ASPIRIN EC) 81 MG EC tablet Take 81 mg by mouth daily. Swallow whole.   Yes Historical Provider, MD  Multiple Vitamins-Minerals (CENTRUM SILVER ULTRA MENS) TABS Take 1 tablet by mouth daily.     Yes Historical Provider, MD  atorvastatin (LIPITOR) 40 MG tablet Take 40 mg by mouth daily.      Historical Provider, MD  cephALEXin (KEFLEX) 500 MG capsule Take 1 capsule (500 mg total) by mouth 2 (two) times daily. Patient not taking: Reported on 04/09/2015 07/07/14   Jan Fireman, PA-C  citalopram (CELEXA) 20 MG tablet Take 20 mg by mouth daily.    Historical Provider, MD  hydrochlorothiazide (HYDRODIURIL) 25 MG tablet Take 25 mg by mouth daily.    Historical Provider, MD  lisinopril (PRINIVIL,ZESTRIL) 40 MG tablet Take 40 mg by mouth daily.    Historical Provider, MD  meloxicam (MOBIC) 15 MG tablet Take 15 mg by mouth daily.    Historical Provider, MD  omeprazole (PRILOSEC) 20 MG capsule Take 20 mg by mouth daily.      Historical Provider, MD  tamsulosin (FLOMAX) 0.4 MG CAPS capsule Take 0.4 mg by mouth.    Historical Provider, MD  zolpidem (AMBIEN) 5 MG tablet Take 5 mg by mouth daily as needed.      Historical Provider, MD    Allergies as of 03/29/2015  . (No Known Allergies)    Family History  Problem Relation Age of Onset  . Rheum arthritis Sister      Social History   Social History  . Marital Status: Married    Spouse Name: N/A  . Number of Children: N/A  . Years of Education: N/A   Occupational History  . Not on file.   Social History Main Topics  . Smoking status: Never Smoker   . Smokeless tobacco: Never Used     Comment: tobacco use - no  . Alcohol Use: Yes     Comment: rare  . Drug Use: No  . Sexual Activity: Yes   Other Topics Concern  . 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 Neck:  Supple; no masses or thyromegaly. Lungs:  Clear throughout to auscultation.    Heart:  Regular rate and rhythm. Abdomen:  Soft, nontender and nondistended. Normal bowel sounds,  without guarding, and without rebound.   Neurologic:  Alert and  oriented x4;  grossly normal neurologically.  Impression/Plan: Roy Berger is here for an colonoscopy to be performed for screening  Risks, benefits, limitations, and alternatives regarding  colonoscopy have been reviewed with the patient.  Questions have been answered.  All parties agreeable.   Josefine Class, MD  04/09/2015, 10:08 AM

## 2015-04-09 NOTE — Op Note (Signed)
[facility] Gastroenterology Patient Name: Roy Berger Procedure Date: 04/09/2015 10:10 AM MRN: [id number] Account #: [id number] Date of Birth: [birth year] Admit Type: Outpatient Age: 65 Room: [facility] ENDO ROOM 1 Gender: Male Note Status: Finalized Procedure:            Colonoscopy Indications:          Screening for colorectal malignant neoplasm, Last                        colonoscopy: 2006 Patient Profile:      This is a 65 year old male. Providers:            Gerrit Heck. Rayann Heman, MD Referring MD:         Ramonita Lab, MD (Referring MD) Medicines:            Propofol per Anesthesia Complications:        No immediate complications. Procedure:            Pre-Anesthesia Assessment:                       - Prior to the procedure, a History and Physical was                        performed, and patient medications, allergies and                        sensitivities were reviewed. The patient's tolerance of                        previous anesthesia was reviewed.                       After obtaining informed consent, the colonoscope was                        passed under direct vision. Throughout the procedure,                        the patient's blood pressure, pulse, and oxygen                        saturations were monitored continuously. The                        Colonoscope was introduced through the anus and                        advanced to the the cecum, identified by appendiceal                        orifice and ileocecal valve. The colonoscopy was                        performed without difficulty. The patient tolerated the                        procedure well. The quality of the bowel preparation                        was good. Findings:      The perianal exam findings include  non-thrombosed external hemorrhoids.      A 4 mm polyp was found in the ascending colon. The polyp was sessile.       The polyp was removed with a cold snare. Resection and  retrieval were       complete.      A few small-mouthed diverticula were found in the sigmoid colon and       ascending colon.      The exam was otherwise without abnormality on direct and retroflexion       views. Impression:           - Non-thrombosed external hemorrhoids found on perianal                        exam.                       - One 4 mm polyp in the ascending colon, removed with a                        cold snare. Resected and retrieved.                       - Diverticulosis in the sigmoid colon and in the                        ascending colon.                       - The examination was otherwise normal on direct and                        retroflexion views. Recommendation:       - Observe patient in GI recovery unit.                       - Resume regular diet.                       - Continue present medications.                       - Await pathology results.                       - Repeat colonoscopy for surveillance based on                        pathology results.                       - Return to referring physician.                       - The findings and recommendations were discussed with                        the patient.                       - The findings and recommendations were discussed with                        the patient's family. Procedure Code(s):    ---  Professional ---                       [phone], Colonoscopy, flexible; with removal of tumor(s),                        polyp(s), or other lesion(s) by snare technique Diagnosis Code(s):    --- Professional ---                       Z12.11, Encounter for screening for malignant neoplasm                        of colon                       D12.2, Benign neoplasm of ascending colon                       K64.4, Residual hemorrhoidal skin tags                       K57.30, Diverticulosis of large intestine without                        perforation or abscess without bleeding CPT  copyright 2016 American Medical Association. All rights reserved. The codes documented in this report are preliminary and upon coder review may  be revised to meet current compliance requirements. Mellody Life, MD 04/09/2015 10:32:48 AM This report has been signed electronically. Number of Addenda: 0 Note Initiated On: 04/09/2015 10:10 AM Scope Withdrawal Time: 0 hours 10 minutes 30 seconds  Total Procedure Duration: 0 hours 12 minutes 31 seconds       [facility]

## 2015-04-09 NOTE — Discharge Instructions (Signed)

## 2015-04-09 NOTE — Anesthesia Preprocedure Evaluation (Signed)
Anesthesia Evaluation  Patient identified by MRN, date of birth, ID band Patient awake    Airway Mallampati: III       Dental  (+) Teeth Intact   Pulmonary sleep apnea ,    Pulmonary exam normal        Cardiovascular Exercise Tolerance: Good hypertension, Pt. on medications  Rhythm:Regular     Neuro/Psych Depression    GI/Hepatic Neg liver ROS, GERD  ,  Endo/Other  negative endocrine ROS  Renal/GU negative Renal ROS     Musculoskeletal   Abdominal Normal abdominal exam  (+)   Peds  Hematology negative hematology ROS (+)   Anesthesia Other Findings   Reproductive/Obstetrics                             Anesthesia Physical Anesthesia Plan  ASA: II  Anesthesia Plan: General   Post-op Pain Management:    Induction: Intravenous  Airway Management Planned: Natural Airway and Nasal Cannula  Additional Equipment:   Intra-op Plan:   Post-operative Plan:   Informed Consent: I have reviewed the patients History and Physical, chart, labs and discussed the procedure including the risks, benefits and alternatives for the proposed anesthesia with the patient or authorized representative who has indicated his/her understanding and acceptance.     Plan Discussed with: CRNA  Anesthesia Plan Comments:         Anesthesia Quick Evaluation

## 2015-04-09 NOTE — Anesthesia Postprocedure Evaluation (Signed)
Anesthesia Post Note  Patient: Roy Berger  Procedure(s) Performed: Procedure(s) (LRB): COLONOSCOPY WITH PROPOFOL (N/A)  Patient location during evaluation: PACU Anesthesia Type: General Level of consciousness: awake Pain management: satisfactory to patient Vital Signs Assessment: post-procedure vital signs reviewed and stable Respiratory status: respiratory function stable Cardiovascular status: stable Anesthetic complications: no    Last Vitals:  Filed Vitals:   04/09/15 1043 04/09/15 1044  BP: 91/49 91/49  Pulse: 57 62  Temp: 36 C 36 C  Resp: 19 16    Last Pain: There were no vitals filed for this visit.               VAN STAVEREN,Smaran Gaus

## 2015-04-10 ENCOUNTER — Encounter: Payer: Self-pay | Admitting: Gastroenterology

## 2015-04-10 LAB — SURGICAL PATHOLOGY

## 2015-09-19 ENCOUNTER — Encounter
Admission: RE | Admit: 2015-09-19 | Discharge: 2015-09-19 | Disposition: A | Discharge status: Home / Self Care | Payer: Medicare HMO | Source: Ambulatory Visit | Attending: Orthopedic Surgery | Admitting: Orthopedic Surgery

## 2015-09-19 DIAGNOSIS — Z01812 Encounter for preprocedural laboratory examination: Secondary | ICD-10-CM | POA: Insufficient documentation

## 2015-09-19 DIAGNOSIS — M1611 Unilateral primary osteoarthritis, right hip: Secondary | ICD-10-CM | POA: Insufficient documentation

## 2015-09-19 DIAGNOSIS — M25551 Pain in right hip: Secondary | ICD-10-CM | POA: Diagnosis not present

## 2015-09-19 HISTORY — DX: Restless legs syndrome: G25.81

## 2015-09-19 HISTORY — DX: Chronic kidney disease, unspecified: N18.9

## 2015-09-19 HISTORY — DX: Anxiety disorder, unspecified: F41.9

## 2015-09-19 LAB — URINALYSIS COMPLETE WITH MICROSCOPIC (ARMC ONLY)
Bacteria, UA: NONE SEEN
Glucose, UA: NEGATIVE mg/dL
HGB URINE DIPSTICK: NEGATIVE
Ketones, ur: NEGATIVE mg/dL
Leukocytes, UA: NEGATIVE
NITRITE: NEGATIVE
PROTEIN: NEGATIVE mg/dL
SPECIFIC GRAVITY, URINE: 1.021 (ref 1.005–1.030)
pH: 5 (ref 5.0–8.0)

## 2015-09-19 LAB — CBC
HEMATOCRIT: 42 % (ref 40.0–52.0)
HEMOGLOBIN: 14.8 g/dL (ref 13.0–18.0)
MCH: 33.2 pg (ref 26.0–34.0)
MCHC: 35.3 g/dL (ref 32.0–36.0)
MCV: 94.3 fL (ref 80.0–100.0)
Platelets: 193 10*3/uL (ref 150–440)
RBC: 4.46 MIL/uL (ref 4.40–5.90)
RDW: 13.6 % (ref 11.5–14.5)
WBC: 6.3 10*3/uL (ref 3.8–10.6)

## 2015-09-19 LAB — PROTIME-INR
INR: 0.96
Prothrombin Time: 12.8 seconds (ref 11.4–15.2)

## 2015-09-19 LAB — BASIC METABOLIC PANEL
Anion gap: 8 (ref 5–15)
BUN: 23 mg/dL — AB (ref 6–20)
CALCIUM: 9.1 mg/dL (ref 8.9–10.3)
CHLORIDE: 104 mmol/L (ref 101–111)
CO2: 26 mmol/L (ref 22–32)
CREATININE: 1 mg/dL (ref 0.61–1.24)
GFR calc Af Amer: >60 mL/min (ref >60)
GFR calc non Af Amer: >60 mL/min (ref >60)
GLUCOSE: 105 mg/dL — AB (ref 65–99)
Potassium: 4.4 mmol/L (ref 3.5–5.1)
Sodium: 138 mmol/L (ref 135–145)

## 2015-09-19 LAB — TYPE AND SCREEN
ABO/RH(D): A POS
ANTIBODY SCREEN: NEGATIVE

## 2015-09-19 LAB — SURGICAL PCR SCREEN
MRSA, PCR: NEGATIVE
Staphylococcus aureus: NEGATIVE

## 2015-09-19 LAB — SEDIMENTATION RATE: SED RATE: 6 mm/h (ref 0–20)

## 2015-09-19 LAB — APTT: aPTT: 29 seconds (ref 24–36)

## 2015-09-19 NOTE — Patient Instructions (Signed)
  Your procedure is scheduled FO:[id number] 5, 2017 (Tuesday) Report to Same Day Surgery 2nd floor Medical Mall To find out your arrival time please call [phone] between 1PM - 3PM on September 28, 2015 (Friday) Remember: Instructions that are not followed completely may result in serious medical risk, up to and including death, or upon the discretion of your surgeon and anesthesiologist your surgery may need to be rescheduled.    _x___ 1. Do not eat food or drink liquids after midnight. No gum chewing or hard candies.     _x__ 2. No Alcohol for 24 hours before or after surgery.   _x___3. No Smoking for 24 prior to surgery.   ____  4. Bring all medications with you on the day of surgery if instructed.    __x__ 5. Notify your doctor if there is any change in your medical condition     (cold, fever, infections).     Do not wear jewelry, make-up, hairpins, clips or nail polish.  Do not wear lotions, powders, or perfumes. You may wear deodorant.  Do not shave 48 hours prior to surgery. Men may shave face and neck.  Do not bring valuables to the hospital.    [facility] is not responsible for any belongings or valuables.               Contacts, dentures or bridgework may not be worn into surgery.  Leave your suitcase in the car. After surgery it may be brought to your room.  For patients admitted to the hospital, discharge time is determined by your treatment team.   Patients discharged the day of surgery will not be allowed to drive home.    Please read over the following fact sheets that you were given:   [facility] Preparing for Surgery and or MRSA Information   _x___ Take these medicines the morning of surgery with A SIP OF WATER:    1. Citalopram   2. Omeprazole (Omeprazole at bedtime on September 4)  3.    ____ Fleet Enema (as directed)   _x___ Use CHG Soap or sage wipes as directed on instruction sheet   ____ Use inhalers on the day of surgery and bring to hospital  day of surgery  ____ Stop metformin 2 days prior to surgery    ____ Take 1/2 of usual insulin dose the night before surgery and none on the morning of   Surgery.           _x___ Stop aspirin or coumadin, or plavix (STOP ASPIRIN TEN DAYS PRIOR TO SURGERY)  _x__ Stop Anti-inflammatories such as Advil, Aleve, Ibuprofen, Motrin, Naproxen,          Naprosyn, Goodies powders or aspirin products. Ok to take Tylenol. (STOP ETODOLAC TEN DAYS PRIOR TO SURGERY)   _x__ Stop supplements until after surgery.  (STOP OCUVITE NOW)  __x__ Bring C-Pap to the hospital.

## 2015-09-20 LAB — URINE CULTURE: Culture: NO GROWTH

## 2015-10-02 ENCOUNTER — Inpatient Hospital Stay
Admission: RE | Admit: 2015-10-02 | Discharge: 2015-10-05 | DRG: 470 | Disposition: A | Discharge status: Home Health | Payer: Medicare HMO | Source: Ambulatory Visit | Attending: Orthopedic Surgery | Admitting: Orthopedic Surgery

## 2015-10-02 ENCOUNTER — Encounter: Payer: Self-pay | Admitting: *Deleted

## 2015-10-02 ENCOUNTER — Inpatient Hospital Stay: Payer: Medicare HMO

## 2015-10-02 ENCOUNTER — Inpatient Hospital Stay: Payer: Medicare HMO | Admitting: Anesthesiology

## 2015-10-02 ENCOUNTER — Encounter
Admission: RE | Disposition: A | Discharge status: Home Health | Payer: Self-pay | Source: Ambulatory Visit | Attending: Orthopedic Surgery

## 2015-10-02 DIAGNOSIS — G2581 Restless legs syndrome: Secondary | ICD-10-CM | POA: Diagnosis present

## 2015-10-02 DIAGNOSIS — K219 Gastro-esophageal reflux disease without esophagitis: Secondary | ICD-10-CM | POA: Diagnosis present

## 2015-10-02 DIAGNOSIS — Z419 Encounter for procedure for purposes other than remedying health state, unspecified: Secondary | ICD-10-CM

## 2015-10-02 DIAGNOSIS — Z791 Long term (current) use of non-steroidal anti-inflammatories (NSAID): Secondary | ICD-10-CM

## 2015-10-02 DIAGNOSIS — Z79899 Other long term (current) drug therapy: Secondary | ICD-10-CM | POA: Diagnosis not present

## 2015-10-02 DIAGNOSIS — Z7982 Long term (current) use of aspirin: Secondary | ICD-10-CM | POA: Diagnosis not present

## 2015-10-02 DIAGNOSIS — I129 Hypertensive chronic kidney disease with stage 1 through stage 4 chronic kidney disease, or unspecified chronic kidney disease: Secondary | ICD-10-CM | POA: Diagnosis present

## 2015-10-02 DIAGNOSIS — N189 Chronic kidney disease, unspecified: Secondary | ICD-10-CM | POA: Diagnosis present

## 2015-10-02 DIAGNOSIS — D62 Acute posthemorrhagic anemia: Secondary | ICD-10-CM | POA: Diagnosis not present

## 2015-10-02 DIAGNOSIS — G4730 Sleep apnea, unspecified: Secondary | ICD-10-CM | POA: Diagnosis present

## 2015-10-02 DIAGNOSIS — M1611 Unilateral primary osteoarthritis, right hip: Principal | ICD-10-CM | POA: Diagnosis present

## 2015-10-02 DIAGNOSIS — Z8261 Family history of arthritis: Secondary | ICD-10-CM

## 2015-10-02 DIAGNOSIS — Z87442 Personal history of urinary calculi: Secondary | ICD-10-CM | POA: Diagnosis not present

## 2015-10-02 DIAGNOSIS — E785 Hyperlipidemia, unspecified: Secondary | ICD-10-CM | POA: Diagnosis present

## 2015-10-02 DIAGNOSIS — G8918 Other acute postprocedural pain: Secondary | ICD-10-CM

## 2015-10-02 HISTORY — PX: TOTAL HIP ARTHROPLASTY: SHX124

## 2015-10-02 LAB — CBC
HCT: 39 % — ABNORMAL LOW (ref 40.0–52.0)
HEMOGLOBIN: 13.2 g/dL (ref 13.0–18.0)
MCH: 32.2 pg (ref 26.0–34.0)
MCHC: 33.8 g/dL (ref 32.0–36.0)
MCV: 95.4 fL (ref 80.0–100.0)
Platelets: 228 10*3/uL (ref 150–440)
RBC: 4.09 MIL/uL — AB (ref 4.40–5.90)
RDW: 13.6 % (ref 11.5–14.5)
WBC: 14.4 10*3/uL — ABNORMAL HIGH (ref 3.8–10.6)

## 2015-10-02 LAB — CREATININE, SERUM
CREATININE: 1 mg/dL (ref 0.61–1.24)
GFR calc Af Amer: >60 mL/min (ref >60)

## 2015-10-02 SURGERY — ARTHROPLASTY, HIP, TOTAL, ANTERIOR APPROACH
Anesthesia: General | Laterality: Right | Wound class: Clean

## 2015-10-02 MED ORDER — BUPIVACAINE-EPINEPHRINE (PF) 0.25% -1:200000 IJ SOLN
INTRAMUSCULAR | Status: AC
  Filled 2015-10-02: qty 30

## 2015-10-02 MED ORDER — LACTATED RINGERS IV SOLN
INTRAVENOUS | Status: DC
  Administered 2015-10-02 (×3): via INTRAVENOUS

## 2015-10-02 MED ORDER — KETAMINE HCL 50 MG/ML IJ SOLN
INTRAMUSCULAR | Status: DC | PRN
  Administered 2015-10-02: 50 mg via INTRAMUSCULAR

## 2015-10-02 MED ORDER — CITALOPRAM HYDROBROMIDE 20 MG PO TABS
20.0000 mg | ORAL_TABLET | Freq: Every day | ORAL | Status: DC
  Administered 2015-10-03 – 2015-10-05 (×3): 20 mg via ORAL
  Filled 2015-10-02 (×3): qty 1

## 2015-10-02 MED ORDER — ACETAMINOPHEN 325 MG PO TABS: 650.0000 mg | ORAL_TABLET | Freq: Four times a day (QID) | ORAL | Status: DC | PRN

## 2015-10-02 MED ORDER — CEFAZOLIN SODIUM-DEXTROSE 2-4 GM/100ML-% IV SOLN
2.0000 g | Freq: Once | INTRAVENOUS | Status: AC
Start: 2015-10-02 — End: 2015-10-02
  Administered 2015-10-02: 2 g via INTRAVENOUS

## 2015-10-02 MED ORDER — METHOCARBAMOL 500 MG PO TABS: 500.0000 mg | ORAL_TABLET | Freq: Four times a day (QID) | ORAL | Status: DC | PRN

## 2015-10-02 MED ORDER — SODIUM CHLORIDE 0.9 % IV SOLN
INTRAVENOUS | Status: DC
  Administered 2015-10-02 (×2): via INTRAVENOUS

## 2015-10-02 MED ORDER — OCUVITE EYE HEALTH GUMMIES PO CHEW: CHEWABLE_TABLET | Freq: Every day | ORAL | Status: DC

## 2015-10-02 MED ORDER — TAMSULOSIN HCL 0.4 MG PO CAPS
0.4000 mg | ORAL_CAPSULE | Freq: Every day | ORAL | Status: DC
  Administered 2015-10-03 – 2015-10-05 (×3): 0.4 mg via ORAL
  Filled 2015-10-02 (×3): qty 1

## 2015-10-02 MED ORDER — ASPIRIN EC 81 MG PO TBEC
81.0000 mg | DELAYED_RELEASE_TABLET | Freq: Every day | ORAL | Status: DC
  Administered 2015-10-02 – 2015-10-05 (×4): 81 mg via ORAL
  Filled 2015-10-02 (×4): qty 1

## 2015-10-02 MED ORDER — DIPHENHYDRAMINE HCL 12.5 MG/5ML PO ELIX: 12.5000 mg | ORAL_SOLUTION | ORAL | Status: DC | PRN

## 2015-10-02 MED ORDER — BUPIVACAINE HCL (PF) 0.5 % IJ SOLN
INTRAMUSCULAR | Status: DC | PRN
  Administered 2015-10-02: 3 mL

## 2015-10-02 MED ORDER — PROPOFOL 500 MG/50ML IV EMUL
INTRAVENOUS | Status: DC | PRN
  Administered 2015-10-02: 75 ug/kg/min via INTRAVENOUS

## 2015-10-02 MED ORDER — PHENOL 1.4 % MT LIQD
1.0000 | OROMUCOSAL | Status: DC | PRN
  Filled 2015-10-02: qty 177

## 2015-10-02 MED ORDER — ALUM & MAG HYDROXIDE-SIMETH 200-200-20 MG/5ML PO SUSP: 30.0000 mL | ORAL | Status: DC | PRN

## 2015-10-02 MED ORDER — EPHEDRINE SULFATE 50 MG/ML IJ SOLN
INTRAMUSCULAR | Status: DC | PRN
  Administered 2015-10-02: 5 mg via INTRAVENOUS
  Administered 2015-10-02 (×3): 10 mg via INTRAVENOUS

## 2015-10-02 MED ORDER — FENTANYL CITRATE (PF) 100 MCG/2ML IJ SOLN: 25.0000 ug | INTRAMUSCULAR | Status: DC | PRN

## 2015-10-02 MED ORDER — ENOXAPARIN SODIUM 40 MG/0.4ML ~~LOC~~ SOLN
40.0000 mg | SUBCUTANEOUS | Status: DC
  Administered 2015-10-03 – 2015-10-05 (×3): 40 mg via SUBCUTANEOUS
  Filled 2015-10-02 (×3): qty 0.4

## 2015-10-02 MED ORDER — METOCLOPRAMIDE HCL 10 MG PO TABS: 5.0000 mg | ORAL_TABLET | Freq: Three times a day (TID) | ORAL | Status: DC | PRN

## 2015-10-02 MED ORDER — ONDANSETRON HCL 4 MG PO TABS: 4.0000 mg | ORAL_TABLET | Freq: Four times a day (QID) | ORAL | Status: DC | PRN

## 2015-10-02 MED ORDER — CEFAZOLIN SODIUM-DEXTROSE 2-4 GM/100ML-% IV SOLN
INTRAVENOUS | Status: AC
  Filled 2015-10-02: qty 100

## 2015-10-02 MED ORDER — ACETAMINOPHEN 650 MG RE SUPP: 650.0000 mg | Freq: Four times a day (QID) | RECTAL | Status: DC | PRN

## 2015-10-02 MED ORDER — MENTHOL 3 MG MT LOZG
1.0000 | LOZENGE | OROMUCOSAL | Status: DC | PRN
Start: 2015-10-02 — End: 2015-10-05
  Filled 2015-10-02: qty 9

## 2015-10-02 MED ORDER — LACTATED RINGERS IV BOLUS (SEPSIS)
500.0000 mL | Freq: Once | INTRAVENOUS | Status: AC
  Administered 2015-10-02: 500 mL via INTRAVENOUS

## 2015-10-02 MED ORDER — OXYCODONE HCL 5 MG PO TABS
5.0000 mg | ORAL_TABLET | ORAL | Status: DC | PRN
  Administered 2015-10-02 (×2): 5 mg via ORAL
  Administered 2015-10-03: 10 mg via ORAL
  Administered 2015-10-03 – 2015-10-05 (×11): 5 mg via ORAL
  Filled 2015-10-02: qty 2
  Filled 2015-10-02 (×2): qty 1
  Filled 2015-10-02: qty 2
  Filled 2015-10-02 (×13): qty 1

## 2015-10-02 MED ORDER — HYDROCHLOROTHIAZIDE 25 MG PO TABS
25.0000 mg | ORAL_TABLET | Freq: Every day | ORAL | Status: DC
  Administered 2015-10-03: 25 mg via ORAL
  Filled 2015-10-02 (×2): qty 1

## 2015-10-02 MED ORDER — MAGNESIUM CITRATE PO SOLN
1.0000 | Freq: Once | ORAL | Status: DC | PRN
Start: 2015-10-02 — End: 2015-10-05
  Filled 2015-10-02: qty 296

## 2015-10-02 MED ORDER — BUPIVACAINE-EPINEPHRINE 0.25% -1:200000 IJ SOLN
INTRAMUSCULAR | Status: DC | PRN
  Administered 2015-10-02: 30 mL

## 2015-10-02 MED ORDER — NEOMYCIN-POLYMYXIN B GU 40-200000 IR SOLN
Status: AC
  Filled 2015-10-02: qty 4

## 2015-10-02 MED ORDER — FENTANYL CITRATE (PF) 100 MCG/2ML IJ SOLN
INTRAMUSCULAR | Status: DC | PRN
  Administered 2015-10-02: 50 ug via INTRAVENOUS

## 2015-10-02 MED ORDER — ONDANSETRON HCL 4 MG/2ML IJ SOLN
4.0000 mg | Freq: Four times a day (QID) | INTRAMUSCULAR | Status: DC | PRN
  Administered 2015-10-02 (×2): 4 mg via INTRAVENOUS
  Filled 2015-10-02 (×2): qty 2

## 2015-10-02 MED ORDER — SODIUM CHLORIDE 0.9 % IV SOLN
INTRAVENOUS | Status: DC | PRN
  Administered 2015-10-02: 50 ug/min via INTRAVENOUS

## 2015-10-02 MED ORDER — NEOMYCIN-POLYMYXIN B GU 40-200000 IR SOLN
Status: DC | PRN
Start: 2015-10-02 — End: 2015-10-02
  Administered 2015-10-02: 4 mL

## 2015-10-02 MED ORDER — MAGNESIUM HYDROXIDE 400 MG/5ML PO SUSP
30.0000 mL | Freq: Every day | ORAL | Status: DC | PRN
  Administered 2015-10-03 – 2015-10-04 (×2): 30 mL via ORAL
  Filled 2015-10-02 (×2): qty 30

## 2015-10-02 MED ORDER — BISACODYL 10 MG RE SUPP
10.0000 mg | Freq: Every day | RECTAL | Status: DC | PRN
  Administered 2015-10-04: 10 mg via RECTAL
  Filled 2015-10-02: qty 1

## 2015-10-02 MED ORDER — LISINOPRIL-HYDROCHLOROTHIAZIDE 20-25 MG PO TABS: 1.0000 | ORAL_TABLET | Freq: Every day | ORAL | Status: DC

## 2015-10-02 MED ORDER — ATORVASTATIN CALCIUM 20 MG PO TABS
40.0000 mg | ORAL_TABLET | ORAL | Status: DC
  Administered 2015-10-03 – 2015-10-05 (×2): 40 mg via ORAL
  Filled 2015-10-02 (×2): qty 2

## 2015-10-02 MED ORDER — PANTOPRAZOLE SODIUM 40 MG PO TBEC
80.0000 mg | DELAYED_RELEASE_TABLET | Freq: Every day | ORAL | Status: DC
  Administered 2015-10-03 – 2015-10-05 (×3): 80 mg via ORAL
  Filled 2015-10-02 (×3): qty 2

## 2015-10-02 MED ORDER — PROMETHAZINE HCL 25 MG/ML IJ SOLN
12.5000 mg | INTRAMUSCULAR | Status: AC
  Administered 2015-10-02: 12.5 mg via INTRAMUSCULAR
  Filled 2015-10-02: qty 1

## 2015-10-02 MED ORDER — PROMETHAZINE HCL 25 MG/ML IJ SOLN: 12.5000 mg | Freq: Four times a day (QID) | INTRAMUSCULAR | Status: DC | PRN

## 2015-10-02 MED ORDER — TRANEXAMIC ACID 1000 MG/10ML IV SOLN
1500.0000 mg | INTRAVENOUS | Status: AC
  Administered 2015-10-02: 1500 mg via INTRAVENOUS
  Filled 2015-10-02: qty 15

## 2015-10-02 MED ORDER — METHOCARBAMOL 1000 MG/10ML IJ SOLN
500.0000 mg | Freq: Four times a day (QID) | INTRAVENOUS | Status: DC | PRN
  Filled 2015-10-02: qty 5

## 2015-10-02 MED ORDER — PHENYLEPHRINE HCL 10 MG/ML IJ SOLN
INTRAMUSCULAR | Status: DC | PRN
  Administered 2015-10-02 (×4): 100 ug via INTRAVENOUS

## 2015-10-02 MED ORDER — ONDANSETRON HCL 4 MG/2ML IJ SOLN: 4.0000 mg | Freq: Once | INTRAMUSCULAR | Status: DC | PRN

## 2015-10-02 MED ORDER — DOCUSATE SODIUM 100 MG PO CAPS
100.0000 mg | ORAL_CAPSULE | Freq: Two times a day (BID) | ORAL | Status: DC
Start: 2015-10-02 — End: 2015-10-05
  Administered 2015-10-02 – 2015-10-05 (×7): 100 mg via ORAL
  Filled 2015-10-02 (×7): qty 1

## 2015-10-02 MED ORDER — ONDANSETRON HCL 4 MG/2ML IJ SOLN
INTRAMUSCULAR | Status: DC | PRN
  Administered 2015-10-02: 4 mg via INTRAVENOUS

## 2015-10-02 MED ORDER — METOCLOPRAMIDE HCL 5 MG/ML IJ SOLN: 5.0000 mg | Freq: Three times a day (TID) | INTRAMUSCULAR | Status: DC | PRN

## 2015-10-02 MED ORDER — LISINOPRIL 20 MG PO TABS
20.0000 mg | ORAL_TABLET | Freq: Every day | ORAL | Status: DC
  Administered 2015-10-03: 20 mg via ORAL
  Filled 2015-10-02 (×2): qty 1

## 2015-10-02 MED ORDER — ADULT MULTIVITAMIN W/MINERALS CH
ORAL_TABLET | Freq: Every day | ORAL | Status: DC
Start: 2015-10-02 — End: 2015-10-05
  Administered 2015-10-02 – 2015-10-05 (×4): 1 via ORAL
  Filled 2015-10-02 (×4): qty 1

## 2015-10-02 MED ORDER — ZOLPIDEM TARTRATE 5 MG PO TABS
5.0000 mg | ORAL_TABLET | Freq: Every evening | ORAL | Status: DC | PRN
  Administered 2015-10-02: 5 mg via ORAL
  Filled 2015-10-02: qty 1

## 2015-10-02 MED ORDER — MORPHINE SULFATE (PF) 2 MG/ML IV SOLN
2.0000 mg | INTRAVENOUS | Status: DC | PRN
  Administered 2015-10-02 (×2): 2 mg via INTRAVENOUS
  Filled 2015-10-02 (×2): qty 1

## 2015-10-02 MED ORDER — ACETAMINOPHEN 10 MG/ML IV SOLN
INTRAVENOUS | Status: AC
  Filled 2015-10-02: qty 100

## 2015-10-02 MED ORDER — CEFAZOLIN SODIUM-DEXTROSE 2-4 GM/100ML-% IV SOLN
2.0000 g | Freq: Four times a day (QID) | INTRAVENOUS | Status: AC
  Administered 2015-10-02 – 2015-10-03 (×3): 2 g via INTRAVENOUS
  Filled 2015-10-02 (×3): qty 100

## 2015-10-02 MED ORDER — ACETAMINOPHEN 10 MG/ML IV SOLN
INTRAVENOUS | Status: DC | PRN
  Administered 2015-10-02: 1000 mg via INTRAVENOUS

## 2015-10-02 MED ORDER — MIDAZOLAM HCL 5 MG/5ML IJ SOLN
INTRAMUSCULAR | Status: DC | PRN
  Administered 2015-10-02: 2 mg via INTRAVENOUS

## 2015-10-02 SURGICAL SUPPLY — 45 items
BLADE SAW SAG 18.5X105 (BLADE) ×3 IMPLANT
BNDG COHESIVE 6X5 TAN STRL LF (GAUZE/BANDAGES/DRESSINGS) ×6 IMPLANT
CANISTER SUCT 1200ML W/VALVE (MISCELLANEOUS) ×3 IMPLANT
CAPT HIP TOTAL 3 ×3 IMPLANT
CATH FOL LEG HOLDER (MISCELLANEOUS) ×3 IMPLANT
CATH TRAY METER 16FR LF (MISCELLANEOUS) ×3 IMPLANT
CHLORAPREP W/TINT 26ML (MISCELLANEOUS) ×3 IMPLANT
DRAPE C-ARM XRAY 36X54 (DRAPES) ×3 IMPLANT
DRAPE INCISE IOBAN 66X60 STRL (DRAPES) IMPLANT
DRAPE POUCH INSTRU U-SHP 10X18 (DRAPES) ×3 IMPLANT
DRAPE SHEET LG 3/4 BI-LAMINATE (DRAPES) ×9 IMPLANT
DRAPE STERI IOBAN 125X83 (DRAPES) ×3 IMPLANT
DRAPE TABLE BACK 80X90 (DRAPES) ×3 IMPLANT
DRSG OPSITE POSTOP 4X8 (GAUZE/BANDAGES/DRESSINGS) IMPLANT
ELECT BLADE 6.5 EXT (BLADE) ×3 IMPLANT
GAUZE SPONGE 4X4 12PLY STRL (GAUZE/BANDAGES/DRESSINGS) ×3 IMPLANT
GLOVE BIOGEL PI IND STRL 9 (GLOVE) ×1 IMPLANT
GLOVE BIOGEL PI INDICATOR 9 (GLOVE) ×2
GLOVE SURG ORTHO 9.0 STRL STRW (GLOVE) ×6 IMPLANT
GOWN SRG 2XL LVL 4 RGLN SLV (GOWNS) ×1 IMPLANT
GOWN STRL NON-REIN 2XL LVL4 (GOWNS) ×2
GOWN STRL REUS W/ TWL LRG LVL3 (GOWN DISPOSABLE) ×1 IMPLANT
GOWN STRL REUS W/TWL LRG LVL3 (GOWN DISPOSABLE) ×2
HEMOVAC 400CC 10FR (MISCELLANEOUS) IMPLANT
HOOD PEEL AWAY FLYTE STAYCOOL (MISCELLANEOUS) ×3 IMPLANT
MAT BLUE FLOOR 46X72 FLO (MISCELLANEOUS) ×3 IMPLANT
NDL SAFETY 18GX1.5 (NEEDLE) ×3 IMPLANT
NEEDLE SPNL 18GX3.5 QUINCKE PK (NEEDLE) ×3 IMPLANT
NS IRRIG 1000ML POUR BTL (IV SOLUTION) ×3 IMPLANT
PACK HIP COMPR (MISCELLANEOUS) ×3 IMPLANT
SOL PREP PVP 2OZ (MISCELLANEOUS) ×3
SOLUTION PREP PVP 2OZ (MISCELLANEOUS) ×1 IMPLANT
STAPLER SKIN PROX 35W (STAPLE) ×3 IMPLANT
STRAP SAFETY BODY (MISCELLANEOUS) ×6 IMPLANT
SUT DVC 2 QUILL PDO  T11 36X36 (SUTURE) ×2
SUT DVC 2 QUILL PDO T11 36X36 (SUTURE) ×1 IMPLANT
SUT DVC QUILL MONODERM 30X30 (SUTURE) ×3 IMPLANT
SUT SILK 0 (SUTURE) ×2
SUT SILK 0 30XBRD TIE 6 (SUTURE) ×1 IMPLANT
SUT VIC AB 1 CT1 36 (SUTURE) ×3 IMPLANT
SYR 20CC LL (SYRINGE) ×3 IMPLANT
SYR 30ML LL (SYRINGE) ×3 IMPLANT
TAPE MICROFOAM 4IN (TAPE) ×3 IMPLANT
TOWEL OR 17X26 4PK STRL BLUE (TOWEL DISPOSABLE) IMPLANT
TUBE KAMVAC SUCTION (TUBING) IMPLANT

## 2015-10-02 NOTE — NC FL2 (Signed)
[place] LEVEL OF CARE SCREENING TOOL     IDENTIFICATION  Patient Name: Roy Berger Birthdate: 09/20/1950 Sex: male Admission Date (Current Location): 10/02/2015  [place] and [place] Number:  Engineering geologist and Address:  [facility], [address] [place], [place] [address]      Provider Number: [id number]  Attending Physician Name and Address:  Hessie Knows, MD  Relative Name and Phone Number:       Current Level of Care: Hospital Recommended Level of Care: [place] Prior Approval Number:    Date Approved/Denied:   PASRR Number:   [id number]   Discharge Plan: SNF    Current Diagnoses: Patient Active Problem List   Diagnosis Date Noted  . Primary localized osteoarthritis of right hip 10/02/2015  . HYPERTENSION, BENIGN 03/20/2010  . Fatigue 03/20/2010  . Hyperlipidemia 05/29/2009  . ANXIETY DEPRESSION 05/29/2009  . DYSPNEA 05/29/2009    Orientation RESPIRATION BLADDER Height & Weight     Self, Time, Situation, Place  Normal Continent Weight: 242 lb (109.8 kg) Height:  6\' 2"  (188 cm)  BEHAVIORAL SYMPTOMS/MOOD NEUROLOGICAL BOWEL NUTRITION STATUS   (None.)  (None.) Continent Diet (Regular Diet. )  AMBULATORY STATUS COMMUNICATION OF NEEDS Skin   Extensive Assist Verbally Surgical wounds (Incision: Right Hip. )                       Personal Care Assistance Level of Assistance  Bathing, Feeding, Dressing Bathing Assistance: Limited assistance Feeding assistance: Independent Dressing Assistance: Limited assistance     Functional Limitations Info  Sight, Hearing, Speech Sight Info: Adequate Hearing Info: Adequate Speech Info: Adequate    SPECIAL CARE FACTORS FREQUENCY  PT (By licensed PT), OT (By licensed OT)     PT Frequency:  (5) OT Frequency:  (5)            Contractures      Additional Factors Info  Code Status, Allergies Code Status Info:  (Full Code. ) Allergies  Info:  (No Known Allergies. )           Current Medications (10/02/2015):  This is the current hospital active medication list Current Facility-Administered Medications  Medication Dose Route Frequency Provider Last Rate Last Dose  . 0.9 %  sodium chloride infusion   Intravenous Continuous Hessie Knows, MD 100 mL/hr at 10/02/15 1218    . acetaminophen (TYLENOL) tablet 650 mg  650 mg Oral Q6H PRN Hessie Knows, MD       Or  . acetaminophen (TYLENOL) suppository 650 mg  650 mg Rectal Q6H PRN Hessie Knows, MD      . alum & mag hydroxide-simeth (MAALOX/MYLANTA) 200-200-20 MG/5ML suspension 30 mL  30 mL Oral Q4H PRN Hessie Knows, MD      . aspirin EC tablet 81 mg  81 mg Oral Daily Hessie Knows, MD      . Derrill Memo ON 10/03/2015] atorvastatin (LIPITOR) tablet 40 mg  40 mg Oral Ceasar Lund, MD      . bisacodyl (DULCOLAX) suppository 10 mg  10 mg Rectal Daily PRN Hessie Knows, MD      . ceFAZolin (ANCEF) 2-4 GM/100ML-% IVPB           . ceFAZolin (ANCEF) IVPB 2g/100 mL premix  2 g Intravenous Q6H Hessie Knows, MD   2 g at 10/02/15 1327  . [START ON 10/03/2015] citalopram (CELEXA) tablet 20 mg  20 mg Oral Daily Hessie Knows, MD      .  diphenhydrAMINE (BENADRYL) 12.5 MG/5ML elixir 12.5-25 mg  12.5-25 mg Oral Q4H PRN Hessie Knows, MD      . docusate sodium (COLACE) capsule 100 mg  100 mg Oral BID Hessie Knows, MD   100 mg at 10/02/15 1218  . [START ON 10/03/2015] enoxaparin (LOVENOX) injection 40 mg  40 mg Subcutaneous Q24H Hessie Knows, MD      . Derrill Memo ON 10/03/2015] lisinopril (PRINIVIL,ZESTRIL) tablet 20 mg  20 mg Oral Daily Hessie Knows, MD       And  . Derrill Memo ON 10/03/2015] hydrochlorothiazide (HYDRODIURIL) tablet 25 mg  25 mg Oral Daily Hessie Knows, MD      . magnesium citrate solution 1 Bottle  1 Bottle Oral Once PRN Hessie Knows, MD      . magnesium hydroxide (MILK OF MAGNESIA) suspension 30 mL  30 mL Oral Daily PRN Hessie Knows, MD      . menthol-cetylpyridinium (CEPACOL) lozenge 3 mg  1 lozenge  Oral PRN Hessie Knows, MD       Or  . phenol (CHLORASEPTIC) mouth spray 1 spray  1 spray Mouth/Throat PRN Hessie Knows, MD      . methocarbamol (ROBAXIN) tablet 500 mg  500 mg Oral Q6H PRN Hessie Knows, MD       Or  . methocarbamol (ROBAXIN) 500 mg in dextrose 5 % 50 mL IVPB  500 mg Intravenous Q6H PRN Hessie Knows, MD      . metoCLOPramide (REGLAN) tablet 5-10 mg  5-10 mg Oral Q8H PRN Hessie Knows, MD       Or  . metoCLOPramide (REGLAN) injection 5-10 mg  5-10 mg Intravenous Q8H PRN Hessie Knows, MD      . morphine 2 MG/ML injection 2 mg  2 mg Intravenous Q1H PRN Hessie Knows, MD      . multivitamin with minerals tablet   Oral Daily Hessie Knows, MD   1 tablet at 10/02/15 1219  . ondansetron (ZOFRAN) tablet 4 mg  4 mg Oral Q6H PRN Hessie Knows, MD       Or  . ondansetron [facility]) injection 4 mg  4 mg Intravenous Q6H PRN Hessie Knows, MD   4 mg at 10/02/15 1220  . oxyCODONE (Oxy IR/ROXICODONE) immediate release tablet 5-10 mg  5-10 mg Oral Q3H PRN Hessie Knows, MD   5 mg at 10/02/15 1219  . [START ON 10/03/2015] pantoprazole (PROTONIX) EC tablet 80 mg  80 mg Oral Daily Hessie Knows, MD      . promethazine [facility]) injection 12.5 mg  12.5 mg Intramuscular STAT Hessie Knows, MD      . promethazine [facility]) injection 12.5 mg  12.5 mg Intravenous Q6H PRN Hessie Knows, MD      . Derrill Memo ON 10/03/2015] tamsulosin (FLOMAX) capsule 0.4 mg  0.4 mg Oral QPC breakfast Hessie Knows, MD      . zolpidem [facility]) tablet 5 mg  5 mg Oral QHS PRN Hessie Knows, MD         Discharge Medications: Please see discharge summary for a list of discharge medications.  Relevant Imaging Results:  Relevant Lab Results:   Additional Information  SSN: [id number]  Danie Chandler, [place] Work [phone]

## 2015-10-02 NOTE — Evaluation (Signed)
Physical Therapy Evaluation Patient Details Name: Roy Berger MRN: [id number] DOB: 10-07-1950 Today's Date: 10/02/2015   History of Present Illness  Pt admitted for R THR. Pt with history of HTN and anxiety.  Clinical Impression  Pt is a pleasant 65 year old male who was admitted for R THR. Pt performs bed mobility, transfers, and ambulation with cga and rw. Pt demonstrates deficits with strength/mobility/pain. Pt very motivated to perform therapy however had near syncopal episode upon transfer from bed->chair. Would benefit from skilled PT to address above deficits and promote optimal return to PLOF. Recommend transition to [place] upon discharge from acute hospitalization.       Follow Up Recommendations Home health PT    Equipment Recommendations       Recommendations for Other Services       Precautions / Restrictions Precautions Precautions: Anterior Hip;Fall Precaution Booklet Issued: No Restrictions Weight Bearing Restrictions: Yes RLE Weight Bearing: Weight bearing as tolerated      Mobility  Bed Mobility Overal bed mobility: Needs Assistance Bed Mobility: Supine to Sit     Supine to sit: Min guard     General bed mobility comments: bed mobility performed with cga. Safe technique performed. Once seated at EOB, pt able to sit with supervision  Transfers Overall transfer level: Needs assistance Equipment used: Rolling walker (2 wheeled) Transfers: Sit to/from Stand Sit to Stand: Min guard         General transfer comment: transfers performed with cga and rw. Once pt stands, pt becomes dizzy, however no syncope noted. RN noted  Ambulation/Gait Ambulation/Gait assistance: Min guard Ambulation Distance (Feet): 3 Feet Assistive device: Rolling walker (2 wheeled) Gait Pattern/deviations: Step-to pattern     General Gait Details: ambulated to recliner with safe technique and step to gait pattern. Verbal cues for sequencing  Stairs             Wheelchair Mobility    Modified Rankin (Stroke Patients Only)       Balance Overall balance assessment: Needs assistance Sitting-balance support: Feet supported Sitting balance-Leahy Scale: Good     Standing balance support: Bilateral upper extremity supported Standing balance-Leahy Scale: Good                               Pertinent Vitals/Pain Pain Assessment: 0-10 Pain Score: 0-No pain    Home Living Family/patient expects to be discharged to:: Private residence Living Arrangements: Spouse/significant other Available Help at Discharge: Family;Available 24 hours/day Type of Home: House Home Access: Stairs to enter Entrance Stairs-Rails: Left Entrance Stairs-Number of Steps: 5 Home Layout: One level Home Equipment: Walker - 2 wheels;Bedside commode (other adaptative equipment including gadgets)      Prior Function Level of Independence: Independent         Comments: recently had been using RW for 1 day     Hand Dominance        Extremity/Trunk Assessment   Upper Extremity Assessment: Overall WFL for tasks assessed           Lower Extremity Assessment: Generalized weakness (R LE grossly 3/5; L LE grossly 5/5)         Communication   Communication: No difficulties  Cognition Arousal/Alertness: Awake/alert Behavior During Therapy: WFL for tasks assessed/performed Overall Cognitive Status: Within Functional Limits for tasks assessed                      General  Comments      Exercises Other Exercises Other Exercises: Pt performed R LE ther-ex in supine including ankle pumps, quad sets, glut sets, and hip abd/add x 10 reps and cga. Safe technique performed      Assessment/Plan    PT Assessment Patient needs continued PT services  PT Diagnosis Difficulty walking;Acute pain;Generalized weakness   PT Problem List Decreased strength;Decreased activity tolerance;Decreased mobility;Decreased knowledge of use of  DME;Decreased safety awareness  PT Treatment Interventions Gait training;DME instruction;Therapeutic exercise   PT Goals (Current goals can be found in the Care Plan section) Acute Rehab PT Goals Patient Stated Goal: to go home PT Goal Formulation: With patient Time For Goal Achievement: 10/16/15 Potential to Achieve Goals: Good    Frequency BID   Barriers to discharge        Co-evaluation               End of Session Equipment Utilized During Treatment: Gait belt Activity Tolerance: Patient tolerated treatment well Patient left: in chair;with chair alarm set Nurse Communication: Mobility status         Time: [id number] PT Time Calculation (min) (ACUTE ONLY): 26 min   Charges:   PT Evaluation $PT Eval Moderate Complexity: 1 Procedure PT Treatments $Therapeutic Exercise: 8-22 mins   PT G Codes:        Robbye Dede 2015-10-14, 4:48 PM Greggory Stallion, PT, DPT [phone]

## 2015-10-02 NOTE — Care Management (Signed)
Patient admitted for hip replacement.  He and his wife says  they have all the equipment- walker and bedside commode.  No agency preference for home health - "as long as my insurance covers and they come to [facility].  [facility] is not able to provide OT.  [facility] is not in network with this particular [employer].  Referral call to [facility].  Called patient's enoxaparin 40 mg sq qd x 14 days to [facility] in [place].  Copay will be 47.00.

## 2015-10-02 NOTE — Anesthesia Procedure Notes (Signed)
Date/Time: 10/02/2015 7:54 AM Performed by: Nelda Marseille Pre-anesthesia Checklist: Patient identified, Emergency Drugs available, Suction available, Patient being monitored and Timeout performed Oxygen Delivery Method: Simple face mask

## 2015-10-02 NOTE — Anesthesia Postprocedure Evaluation (Addendum)
Anesthesia Post Note  Patient: Roy Berger  Procedure(s) Performed: Procedure(s) (LRB): TOTAL HIP ARTHROPLASTY ANTERIOR APPROACH (Right)  Patient location during evaluation: PACU Anesthesia Type: Spinal Level of consciousness: awake and alert Pain management: pain level controlled Vital Signs Assessment: post-procedure vital signs reviewed and stable Respiratory status: spontaneous breathing, nonlabored ventilation, respiratory function stable and patient connected to nasal cannula oxygen Cardiovascular status: blood pressure returned to baseline and stable Postop Assessment: no signs of nausea or vomiting Anesthetic complications: no    Last Vitals:  Vitals:   10/02/15 1621 10/02/15 1811  BP: 111/60 136/81  Pulse: 73 91  Resp: 18   Temp: 36.6 C     Last Pain:  Vitals:   10/02/15 1705  TempSrc:   PainSc: 0-No pain                 Molli Barrows

## 2015-10-02 NOTE — OR Nursing (Signed)
Sacral dressing, Ancef 2 gm, TXA 1.5gm sent to OR with patient To OR with thermal cap in place

## 2015-10-02 NOTE — Progress Notes (Signed)
Patient's daughter concerned that urine in foley bag is dark, and is requesting urinalysis. Dr Rudene Christians paged. Waiting on callback.

## 2015-10-02 NOTE — Anesthesia Preprocedure Evaluation (Signed)
Anesthesia Evaluation  Patient identified by MRN, date of birth, ID band Patient awake    Reviewed: Allergy & Precautions, H&P , NPO status , Patient's Chart, lab work & pertinent test results, reviewed documented beta blocker date and time   Airway Mallampati: II   Neck ROM: full    Dental  (+) Poor Dentition   Pulmonary neg pulmonary ROS, shortness of breath and with exertion, sleep apnea and Continuous Positive Airway Pressure Ventilation ,    Pulmonary exam normal        Cardiovascular hypertension, On Medications negative cardio ROS Normal cardiovascular exam Rhythm:regular Rate:Normal     Neuro/Psych PSYCHIATRIC DISORDERS negative neurological ROS  negative psych ROS   GI/Hepatic negative GI ROS, Neg liver ROS, GERD  Medicated,  Endo/Other  negative endocrine ROS  Renal/GU Renal diseasenegative Renal ROS  negative genitourinary   Musculoskeletal   Abdominal   Peds  Hematology negative hematology ROS (+)   Anesthesia Other Findings Past Medical History: No date: Anxiety No date: Arthritis No date: Benign prostatic hyperplasia with urinary obst* No date: Chronic kidney disease     Comment: Kidney stones in the past No date: GERD (gastroesophageal reflux disease) No date: HLD (hyperlipidemia) No date: HTN (hypertension) No date: Insomnia No date: Restless leg syndrome No date: Sleep apnea     Comment: Use C- PAP Past Surgical History: No date: CERVICAL FUSION     Comment: Anterior 04/09/2015: COLONOSCOPY WITH PROPOFOL N/A     Comment: Procedure: COLONOSCOPY WITH PROPOFOL;                Surgeon: Josefine Class, MD;  Location:               [facility] ENDOSCOPY;  Service: Endoscopy;                Laterality: N/A; No date: HERNIA REPAIR No date: UMBILICAL HERNIA REPAIR BMI    Body Mass Index:  31.07 kg/m     Reproductive/Obstetrics                             Anesthesia  Physical Anesthesia Plan  ASA: III  Anesthesia Plan: General and Spinal   Post-op Pain Management:    Induction:   Airway Management Planned:   Additional Equipment:   Intra-op Plan:   Post-operative Plan:   Informed Consent: I have reviewed the patients History and Physical, chart, labs and discussed the procedure including the risks, benefits and alternatives for the proposed anesthesia with the patient or authorized representative who has indicated his/her understanding and acceptance.   Dental Advisory Given  Plan Discussed with: CRNA  Anesthesia Plan Comments:         Anesthesia Quick Evaluation

## 2015-10-02 NOTE — H&P (Signed)
TOTAL HIP ADMISSION H&P  Patient is admitted for right total hip arthroplasty.  Subjective:  Chief Complaint: right hip pain  HPI: Roy Berger, 65 y.o. male, has a history of pain and functional disability in the right hip(s) due to arthritis and patient has failed non-surgical conservative treatments for greater than 12 weeks to include NSAID's and/or analgesics, flexibility and strengthening excercises and activity modification.  Onset of symptoms was gradual starting several years ago with gradually worsening course since that time.The patient noted no past surgery on the right hip(s).  Patient currently rates pain in the right hip at 8 out of 10 with activity. Patient has night pain, worsening of pain with activity and weight bearing, pain that interfers with activities of daily living and pain with passive range of motion. Patient has evidence of subchondral cysts, subchondral sclerosis, periarticular osteophytes, joint subluxation and joint space narrowing by imaging studies. This condition presents safety issues increasing the risk of falls.   There is no current active infection.  Patient Active Problem List   Diagnosis Date Noted  . HYPERTENSION, BENIGN 03/20/2010  . Fatigue 03/20/2010  . Hyperlipidemia 05/29/2009  . ANXIETY DEPRESSION 05/29/2009  . DYSPNEA 05/29/2009   Past Medical History:  Diagnosis Date  . Anxiety   . Arthritis   . Benign prostatic hyperplasia with urinary obstruction and other lower urinary tract symptoms   . Chronic kidney disease    Kidney stones in the past  . GERD (gastroesophageal reflux disease)   . HLD (hyperlipidemia)   . HTN (hypertension)   . Insomnia   . Restless leg syndrome   . Sleep apnea    Use C- PAP    Past Surgical History:  Procedure Laterality Date  . CERVICAL FUSION     Anterior  . COLONOSCOPY WITH PROPOFOL N/A 04/09/2015   Procedure: COLONOSCOPY WITH PROPOFOL;  Surgeon: Josefine Class, MD;  Location: [facility];   Service: Endoscopy;  Laterality: N/A;  . HERNIA REPAIR    . UMBILICAL HERNIA REPAIR      Prescriptions Prior to Admission  Medication Sig Dispense Refill Last Dose  . aspirin (ASPIRIN EC) 81 MG EC tablet Take 81 mg by mouth daily. Swallow whole.   "it's been more than 10 days"  . atorvastatin (LIPITOR) 40 MG tablet Take 40 mg by mouth every other day. bedtime   10/01/2015 at pm  . cephALEXin (KEFLEX) 500 MG capsule Take 1 capsule (500 mg total) by mouth 2 (two) times daily. 20 capsule 0 10/02/2015 at 0445  . citalopram (CELEXA) 20 MG tablet Take 20 mg by mouth daily.   10/02/2015 at 0445  . etodolac (LODINE) 500 MG tablet Take 500 mg by mouth 2 (two) times daily.   "10 or 12 days ago"  . lisinopril-hydrochlorothiazide (PRINZIDE,ZESTORETIC) 20-25 MG tablet Take 1 tablet by mouth daily.   10/02/2015 at 0445  . Multiple Vitamins-Minerals (CENTRUM SILVER ULTRA MENS) TABS Take 1 tablet by mouth daily.     "10-12 days ago"  . Multiple Vitamins-Minerals (OCUVITE EYE HEATLH GUMMIES PO) Take 1 capsule by mouth daily.   "10 or 12 days ago"Z  . omeprazole (PRILOSEC) 20 MG capsule Take 20 mg by mouth every other day.    10/02/2015 at 0445  . tamsulosin (FLOMAX) 0.4 MG CAPS capsule Take 0.4 mg by mouth daily after breakfast.    10/01/2015 at am  . zolpidem (AMBIEN) 5 MG tablet Take 5 mg by mouth daily as needed.  10/01/2015 at pm  . hydrochlorothiazide (HYDRODIURIL) 25 MG tablet Take 25 mg by mouth daily.   Not Taking  . lisinopril (PRINIVIL,ZESTRIL) 40 MG tablet Take by mouth daily. Now a combination drug with HCTZ   Not Taking  . meloxicam (MOBIC) 15 MG tablet Take 15 mg by mouth daily.   "10-12 days ago"   No Known Allergies  Social History  Substance Use Topics  . Smoking status: Never Smoker  . Smokeless tobacco: Never Used     Comment: tobacco use - no  . Alcohol use Yes     Comment: rare    Family History  Problem Relation Age of Onset  . Rheum arthritis Sister      ROS  Objective:  Physical  Exam  Vital signs in last 24 hours: Temp:  [97.9 F (36.6 C)] 97.9 F (36.6 C) (09/05 0603) Pulse Rate:  [70] 70 (09/05 0603) Resp:  [18] 18 (09/05 0603) BP: (130)/(79) 130/79 (09/05 0603) SpO2:  [97 %] 97 % (09/05 0603) Weight:  [109.8 kg (242 lb)] 109.8 kg (242 lb) (09/05 0603) Lungs clear Heart RRR HEENT normal 0 degrees internal rotation, 20 degrees external rotation right hip, no flexion contracture NV intact RLE Labs:   Estimated body mass index is 31.07 kg/m as calculated from the following:   Height as of this encounter: 6\' 2"  (1.88 m).   Weight as of this encounter: 109.8 kg (242 lb).   Imaging Review  AP and lateral x-rays of the right hip were ordered and personally reviewed. These show severe osteoarthritis with loss of superior and medial joint space.   There is a larger inferior osteophyte off the acetabulum with large osteophytes laterally and anteriorly around the femoral head.    Otherwise, this is a normal pelvis with mild central osteoarthritis of the left hip. There is no acute process.   X-ray Impression: Severe osteoarthritis with almost complete loss of joint space of right hip.  Assessment/Plan:  End stage arthritis, right hip(s)  The patient history, physical examination, clinical judgement of the provider and imaging studies are consistent with end stage degenerative joint disease of the right hip(s) and total hip arthroplasty is deemed medically necessary. The treatment options including medical management, injection therapy, arthroscopy and arthroplasty were discussed at length. The risks and benefits of total hip arthroplasty were presented and reviewed. The risks due to aseptic loosening, infection, stiffness, dislocation/subluxation,  thromboembolic complications and other imponderables were discussed.  The patient acknowledged the explanation, agreed to proceed with the plan and consent was signed. Patient is being admitted for inpatient  treatment for surgery, pain control, PT, OT, prophylactic antibiotics, VTE prophylaxis, progressive ambulation and ADL's and discharge planning.The patient is planning to be discharged home with home health services

## 2015-10-02 NOTE — Progress Notes (Signed)
Per Dr Rudene Christians, patient's urine is dark colored from bleeding from insertion of foley in OR. Patient and family updated. Will recheck urine tomorrow, if still discolored, will get UA per Dr Rudene Christians.

## 2015-10-02 NOTE — Op Note (Signed)
10/02/2015  9:47 AM  PATIENT:  Roy Berger  65 y.o. male  PRE-OPERATIVE DIAGNOSIS:  primary osteoarthritis right hip  POST-OPERATIVE DIAGNOSIS:  primary osteoarthritis right hip  PROCEDURE:  Procedure(s): TOTAL HIP ARTHROPLASTY ANTERIOR APPROACH (Right)  SURGEON: Laurene Footman, MD  ASSISTANTS: None  ANESTHESIA:   spinal  EBL:  Total I/O In: 1200 [I.V.:1200] Out: 900 [Urine:400; Blood:500]  BLOOD ADMINISTERED:none  DRAINS: none   LOCAL MEDICATIONS USED:  MARCAINE     SPECIMEN:  Source of Specimen:  Right femoral head  DISPOSITION OF SPECIMEN:  PATHOLOGY  COUNTS:  YES  TOURNIQUET:  * No tourniquets in log *  IMPLANTS: Medacta AMIS 5 lateralized stem with S 28 mm head, 58 mm Mpact cup DM with liner  DICTATION: .Dragon Dictation   The patient was brought to the operating room and after spinal anesthesia was obtained patient was placed on the operative table with the ipsilateral foot into the Medacta attachment, contralateral leg on a well-padded table. C-arm was brought in and preop template x-ray taken. After prepping and draping in usual sterile fashion appropriate patient identification and timeout procedures were completed. Anterior approach to the hip was obtained and centered over the greater trochanter and TFL muscle. The subcutaneous tissue was incised hemostasis being achieved by electrocautery. TFL fascia was incised and the muscle retracted laterally deep retractor placed. The lateral femoral circumflex vessels were identified and ligated. The anterior capsule was exposed and a capsulotomy performed. The neck was identified and a femoral neck cut carried out with a saw. The head was removed without difficulty and showed sclerotic femoral head and acetabulum. Reaming was carried out to 58 mm  and a 58 mm cup trial gave appropriate tightness to the acetabular component a 58 mm Mpact cup DM was impacted into position. The leg was then externally rotated and  ischiofemoral and pubofemoral releases carried out. The femur was sequentially broached to a size 5, size 5 stem with lateralized neck and S head trials were placed and the final components chosen. The 5 lateralized stem was inserted along with a S 28 mm head and 58 mm liner. The hip was reduced and was stable the wound was thoroughly irrigated.. The deep fascia was closed using a heavy Quill after infiltration of 30 cc of quarter percent Sensorcaine with epinephrine. 2-0 Quill to close the skin with skin staples. Xeroform and honeycomb dressing applied  PLAN OF CARE: Admit to inpatient

## 2015-10-02 NOTE — Transfer of Care (Signed)
Immediate Anesthesia Transfer of Care Note  Patient: Roy Berger  Procedure(s) Performed: Procedure(s): TOTAL HIP ARTHROPLASTY ANTERIOR APPROACH (Right)  Patient Location: PACU  Anesthesia Type:Spinal  Level of Consciousness: sedated  Airway & Oxygen Therapy: Patient Spontanous Breathing and Patient connected to face mask oxygen  Post-op Assessment: Report given to RN and Post -op Vital signs reviewed and stable  Post vital signs: Reviewed and stable  Last Vitals:  Vitals:   10/02/15 0603  BP: 130/79  Pulse: 70  Resp: 18  Temp: 36.6 C    Last Pain:  Vitals:   10/02/15 0603  TempSrc: Oral  PainSc: 4          Complications: No apparent anesthesia complications

## 2015-10-02 NOTE — Anesthesia Procedure Notes (Signed)
Spinal  Patient location during procedure: OR Start time: 10/02/2015 7:25 AM End time: 10/02/2015 7:30 AM Staffing Resident/CRNA: Nelda Marseille Performed: resident/CRNA  Preanesthetic Checklist Completed: patient identified, site marked, surgical consent, pre-op evaluation, timeout performed, IV checked, risks and benefits discussed and monitors and equipment checked Spinal Block Patient position: sitting Prep: Betadine Patient monitoring: heart rate, continuous pulse ox, blood pressure and cardiac monitor Approach: midline Location: L4-5 Injection technique: single-shot Needle Needle type: Whitacre and Introducer  Needle gauge: 24 G Needle length: 9 cm Assessment Sensory level: T10 Additional Notes Negative paresthesia. Negative blood return. Positive free-flowing CSF. Expiration date of kit checked and confirmed. Patient tolerated procedure well, without complications.

## 2015-10-03 ENCOUNTER — Encounter: Payer: Self-pay | Admitting: Orthopedic Surgery

## 2015-10-03 LAB — CBC
HCT: 35 % — ABNORMAL LOW (ref 40.0–52.0)
Hemoglobin: 12.5 g/dL — ABNORMAL LOW (ref 13.0–18.0)
MCH: 33.9 pg (ref 26.0–34.0)
MCHC: 35.8 g/dL (ref 32.0–36.0)
MCV: 94.7 fL (ref 80.0–100.0)
PLATELETS: 205 10*3/uL (ref 150–440)
RBC: 3.69 MIL/uL — ABNORMAL LOW (ref 4.40–5.90)
RDW: 13 % (ref 11.5–14.5)
WBC: 9.9 10*3/uL (ref 3.8–10.6)

## 2015-10-03 LAB — BASIC METABOLIC PANEL
Anion gap: 6 (ref 5–15)
BUN: 14 mg/dL (ref 6–20)
CALCIUM: 8.1 mg/dL — AB (ref 8.9–10.3)
CO2: 28 mmol/L (ref 22–32)
Chloride: 99 mmol/L — ABNORMAL LOW (ref 101–111)
Creatinine, Ser: 1.09 mg/dL (ref 0.61–1.24)
GFR calc Af Amer: >60 mL/min (ref >60)
GLUCOSE: 138 mg/dL — AB (ref 65–99)
Potassium: 3.5 mmol/L (ref 3.5–5.1)
Sodium: 133 mmol/L — ABNORMAL LOW (ref 135–145)

## 2015-10-03 NOTE — Progress Notes (Signed)
Physical Therapy Treatment Patient Details Name: Roy Berger MRN: [id number] DOB: Jul 06, 1950 Today's Date: 10/03/2015    History of Present Illness Pt. is admitted for a Right THR.    PT Comments    Pt in bed, agrees.  Participated in exercises as described below.  Transitioned to edge of bed (left to simulate home) with increased time and use of rail.  Stood and was able to ambulate 130' with walker and min guard.  No LOB and better step pattern this pm.  Pt does have 5 stairs with 1 rail or 4 steps with no rails to enter home.  Stairs deferred today due to general fatigue but will address tomorrow.  Pt sat up in chair x 1 hour this am.  Returned to chair after session this pm for lunch.   Follow Up Recommendations  Home health PT     Equipment Recommendations       Recommendations for Other Services       Precautions / Restrictions Precautions Precautions: Anterior Hip Precaution Booklet Issued: No Restrictions Weight Bearing Restrictions: Yes RLE Weight Bearing: Weight bearing as tolerated    Mobility  Bed Mobility Overal bed mobility: Needs Assistance Bed Mobility: Supine to Sit     Supine to sit: Min guard     General bed mobility comments: increased time but able to manage without assist  Transfers Overall transfer level: Needs assistance Equipment used: Rolling walker (2 wheeled) Transfers: Sit to/from Stand Sit to Stand: Min guard         General transfer comment: verbal cues for proper hand placements  Ambulation/Gait Ambulation/Gait assistance: Min guard Ambulation Distance (Feet): 130 Feet Assistive device: Rolling walker (2 wheeled) Gait Pattern/deviations: Step-to pattern   Gait velocity interpretation: Below normal speed for age/gender General Gait Details: better step pattern this pm   Stairs            Wheelchair Mobility    Modified Rankin (Stroke Patients Only)       Balance Overall balance assessment: Needs  assistance Sitting-balance support: Feet supported Sitting balance-Leahy Scale: Good     Standing balance support: Bilateral upper extremity supported Standing balance-Leahy Scale: Good                      Cognition Arousal/Alertness: Awake/alert Behavior During Therapy: WFL for tasks assessed/performed Overall Cognitive Status: Within Functional Limits for tasks assessed                      Exercises Other Exercises Other Exercises: Pt performed R LE ther-ex in supine including ankle pumps, quad sets, glut sets, and hip abd/add x 10 reps and cga. Safe technique performed    General Comments        Pertinent Vitals/Pain Pain Assessment: 0-10 Pain Score: 4  Pain Location: R hip Pain Descriptors / Indicators: Aching;Operative site guarding Pain Intervention(s): Limited activity within patient's tolerance    Home Living Family/patient expects to be discharged to:: Private residence Living Arrangements: Spouse/significant other Available Help at Discharge: Family;Available 24 hours/day Type of Home: House Home Access: Stairs to enter Entrance Stairs-Rails: Left Home Layout: One level Home Equipment: Walker - 2 wheels;Bedside commode;Cane - single point;Other (comment) (reachetr, and sockaid)      Prior Function Level of Independence: Independent          PT Goals (current goals can now be found in the care plan section) Acute Rehab PT Goals Patient Stated Goal: To retrun  home    Frequency  BID    PT Plan Current plan remains appropriate    Co-evaluation             End of Session Equipment Utilized During Treatment: Gait belt Activity Tolerance: Patient tolerated treatment well Patient left: in chair;with chair alarm set;with call bell/phone within reach;with family/visitor present     Time: [id number] PT Time Calculation (min) (ACUTE ONLY): 24 min  Charges:  $Gait Training: 8-22 mins $Therapeutic Exercise: 8-22 mins                     G Codes:      Chesley Noon, PTA 10/03/15, 12:22 PM

## 2015-10-03 NOTE — Progress Notes (Signed)
Physical Therapy Treatment Patient Details Name: Roy Berger MRN: [id number] DOB: 24-Jun-1950 Today's Date: 10/03/2015    History of Present Illness Pt admitted for R THR. Pt with history of HTN and anxiety.    PT Comments    PT in bed ready for session.  Participated in exercises as described below.  BP in supine 131/68. To edge of bed with increased time and rail with min guard.  He stood with min guard and verbal cues for correct hand placement.  He was able to ambulate 100' with walker and min guard with no loss of balance or complaints of dizziness.  He did have an irregular step pattern and required verbal cues to take smaller, regular steps.     Follow Up Recommendations  Home health PT     Equipment Recommendations       Recommendations for Other Services       Precautions / Restrictions Precautions Precautions: Anterior Hip;Fall Precaution Booklet Issued: No Restrictions Weight Bearing Restrictions: Yes RLE Weight Bearing: Weight bearing as tolerated    Mobility  Bed Mobility Overal bed mobility: Needs Assistance Bed Mobility: Supine to Sit     Supine to sit: Min guard     General bed mobility comments: bed mobility performed with cga. Safe technique performed. Once seated at EOB, pt able to sit with supervision  Transfers Overall transfer level: Needs assistance Equipment used: Rolling walker (2 wheeled) Transfers: Sit to/from Stand Sit to Stand: Min guard         General transfer comment: verbal cues for proper hand placements  Ambulation/Gait Ambulation/Gait assistance: Min guard Ambulation Distance (Feet): 100 Feet Assistive device: Rolling walker (2 wheeled) Gait Pattern/deviations: Step-to pattern (irregular pattern and length.  verbal cues to correct)   Gait velocity interpretation: Below normal speed for age/gender General Gait Details: no dizziness noted   Stairs            Wheelchair Mobility    Modified Rankin (Stroke  Patients Only)       Balance Overall balance assessment: Needs assistance Sitting-balance support: Feet supported Sitting balance-Leahy Scale: Good     Standing balance support: Bilateral upper extremity supported Standing balance-Leahy Scale: Good                      Cognition Arousal/Alertness: Awake/alert Behavior During Therapy: WFL for tasks assessed/performed Overall Cognitive Status: Within Functional Limits for tasks assessed                      Exercises Other Exercises Other Exercises: Pt performed R LE ther-ex in supine including ankle pumps, quad sets, glut sets, and hip abd/add x 10 reps and cga. Safe technique performed    General Comments        Pertinent Vitals/Pain Pain Assessment: 0-10 Pain Score: 5  Pain Location: R hip Pain Descriptors / Indicators: Aching;Constant    Home Living                      Prior Function            PT Goals (current goals can now be found in the care plan section)      Frequency  BID    PT Plan Current plan remains appropriate    Co-evaluation             End of Session Equipment Utilized During Treatment: Gait belt Activity Tolerance: Patient tolerated treatment well Patient  left: in chair;with chair alarm set;with call bell/phone within reach;with family/visitor present     Time: 0853-0919 PT Time Calculation (min) (ACUTE ONLY): 26 min  Charges:  $Gait Training: 8-22 mins $Therapeutic Exercise: 8-22 mins                    G Codes:      Chesley Noon, PTA 10/03/15, 10:19 AM

## 2015-10-03 NOTE — Evaluation (Signed)
Occupational Therapy Evaluation Patient Details Name: Roy Berger MRN: [id number] DOB: 1950/03/02 Today's Date: 10/03/2015    History of Present Illness Pt. is admitted for a Right THR.   Clinical Impression   Pt is 65 year old Male s/p right THR(Anterior Approach)  who lives at home with her husband. Pt was independent in all ADLs prior to surgery and is eager to return to PLOF.  Pt is currently limited in functional ADLs due to pain, decreased ROM, and decreased functional mobility during ADLs.  Pt. would benefit from continued skilled OT services for education in assistive devices, functional mobility, and education in recommendations for home modifications to increase safety and prevent falls.      Follow Up Recommendations  No OT follow up    Equipment Recommendations       Recommendations for Other Services PT consult     Precautions / Restrictions Precautions Precautions: Anterior Hip Precaution Booklet Issued: No Restrictions Weight Bearing Restrictions: Yes RLE Weight Bearing: Weight bearing as tolerated              ADL Overall ADL's : Needs assistance/impaired Eating/Feeding: Set up   Grooming: Set up               Lower Body Dressing: Minimal assistance               Functional mobility during ADLs: Min guard General ADL Comments: Pt. education was provided about A/E for LE dressing.     Vision     Perception     Praxis      Pertinent Vitals/Pain Pain Assessment: 0-10 Pain Score: 4  Pain Location: Right Hip Pain Descriptors / Indicators: Aching Pain Intervention(s): Limited activity within patient's tolerance     Hand Dominance Right   Extremity/Trunk Assessment Upper Extremity Assessment Upper Extremity Assessment: Overall WFL for tasks assessed           Communication Communication Communication: No difficulties   Cognition Arousal/Alertness: Awake/alert Behavior During Therapy: WFL for tasks  assessed/performed Overall Cognitive Status: Within Functional Limits for tasks assessed                     General Comments       Exercises   Other Exercises Other Exercises: Pt performed R LE ther-ex in supine including ankle pumps, quad sets, glut sets, and hip abd/add x 10 reps and cga. Safe technique performed   Shoulder Instructions      Home Living Family/patient expects to be discharged to:: Private residence Living Arrangements: Spouse/significant other Available Help at Discharge: Family;Available 24 hours/day Type of Home: House Home Access: Stairs to enter   Entrance Stairs-Rails: Left Home Layout: One level     Bathroom Shower/Tub: Tub/shower unit Shower/tub characteristics: Curtain Biochemist, clinical: Standard Bathroom Accessibility: Yes   Home Equipment: Environmental consultant - 2 wheels;Bedside commode;Cane - single point;Other (comment) (reachetr, and sockaid)          Prior Functioning/Environment Level of Independence: Independent             OT Diagnosis: Generalized weakness   OT Problem List: Decreased strength;Pain;Decreased safety awareness;Decreased knowledge of use of DME or AE   OT Treatment/Interventions: Self-care/ADL training;Therapeutic exercise;DME and/or AE instruction;Therapeutic activities;Patient/family education    OT Goals(Current goals can be found in the care plan section) Acute Rehab OT Goals Patient Stated Goal: To retrun home OT Goal Formulation: With patient Potential to Achieve Goals: Good  OT Frequency: Min 1X/week   Barriers  to D/C:            Co-evaluation              End of Session    Activity Tolerance: Patient tolerated treatment well Patient left: in bed   Time: 0938-1001 OT Time Calculation (min): 23 min Charges:  OT General Charges $OT Visit: 1 Procedure OT Evaluation $OT Eval Moderate Complexity: 1 Procedure G-Codes:    Harrel Carina, MS, OTR/L 10/03/2015, 11:00 AM

## 2015-10-03 NOTE — Progress Notes (Signed)
Clinical Social Worker (CSW) received SNF consult. PT is recommending home health. RN Case Manager is aware of above. Please reconsult if future social work needs arise. CSW signing off.   Edword Cu, LCSW [phone] 

## 2015-10-03 NOTE — Addendum Note (Signed)
Addendum  created 10/03/15 0726 by Nelda Marseille, CRNA   Sign clinical note

## 2015-10-03 NOTE — Progress Notes (Signed)
   Subjective: 1 Day Post-Op Procedure(s) (LRB): TOTAL HIP ARTHROPLASTY ANTERIOR APPROACH (Right) Patient reports pain as mild.   Patient is well, and has had no acute complaints or problems Denies any CP, SOB, ABD pain. We will continue therapy today.  Plan is to go Home after hospital stay.  Objective: Vital signs in last 24 hours: Temp:  [97.4 F (36.3 C)-100.2 F (37.9 C)] 99.5 F (37.5 C) (09/06 0328) Pulse Rate:  [51-91] 68 (09/06 0328) Resp:  [16-19] 19 (09/06 0328) BP: (75-136)/(47-81) 127/72 (09/06 0328) SpO2:  [92 %-100 %] 92 % (09/06 0328)  Intake/Output from previous day: 09/05 0701 - 09/06 0700 In: 2600 [P.O.:600; I.V.:2000] Out: 3850 [Urine:3350; Blood:500] Intake/Output this shift: No intake/output data recorded.   Recent Labs  10/02/15 1207 10/03/15 0420  HGB 13.2 12.5*    Recent Labs  10/02/15 1207 10/03/15 0420  WBC 14.4* 9.9  RBC 4.09* 3.69*  HCT 39.0* 35.0*  PLT 228 205    Recent Labs  10/02/15 1207 10/03/15 0420  NA  --  133*  K  --  3.5  CL  --  99*  CO2  --  28  BUN  --  14  CREATININE 1.00 1.09  GLUCOSE  --  138*  CALCIUM  --  8.1*   No results for input(s): LABPT, INR in the last 72 hours.  EXAM General - Patient is Alert, Appropriate and Oriented Extremity - Neurovascular intact Sensation intact distally Intact pulses distally Dorsiflexion/Plantar flexion intact No cellulitis present Dressing - no drainage Motor Function - intact, moving foot and toes well on exam.   Past Medical History:  Diagnosis Date  . Anxiety   . Arthritis   . Benign prostatic hyperplasia with urinary obstruction and other lower urinary tract symptoms   . Chronic kidney disease    Kidney stones in the past  . GERD (gastroesophageal reflux disease)   . HLD (hyperlipidemia)   . HTN (hypertension)   . Insomnia   . Restless leg syndrome   . Sleep apnea    Use C- PAP    Assessment/Plan:   1 Day Post-Op Procedure(s) (LRB): TOTAL HIP  ARTHROPLASTY ANTERIOR APPROACH (Right) Active Problems:   Primary localized osteoarthritis of right hip   Acute post op blood loss anemia   Estimated body mass index is 31.07 kg/m as calculated from the following:   Height as of this encounter: 6\' 2"  (1.88 m).   Weight as of this encounter: 109.8 kg (242 lb). Advance diet Up with therapy  Needs BM Acute post op blood loss anemia- recheck labs in the am Encouraged incentive spirometer    DVT Prophylaxis - Lovenox, Foot Pumps and TED hose Weight-Bearing as tolerated to right leg   T. Rachelle Hora, PA-C [place] 10/03/2015, 8:18 AM

## 2015-10-04 LAB — CBC
HEMATOCRIT: 34 % — AB (ref 40.0–52.0)
Hemoglobin: 11.9 g/dL — ABNORMAL LOW (ref 13.0–18.0)
MCH: 33.4 pg (ref 26.0–34.0)
MCHC: 35.1 g/dL (ref 32.0–36.0)
MCV: 95.3 fL (ref 80.0–100.0)
Platelets: 188 10*3/uL (ref 150–440)
RBC: 3.57 MIL/uL — ABNORMAL LOW (ref 4.40–5.90)
RDW: 13.4 % (ref 11.5–14.5)
WBC: 11.2 10*3/uL — ABNORMAL HIGH (ref 3.8–10.6)

## 2015-10-04 LAB — BASIC METABOLIC PANEL
Anion gap: 4 — ABNORMAL LOW (ref 5–15)
BUN: 15 mg/dL (ref 6–20)
CALCIUM: 8.3 mg/dL — AB (ref 8.9–10.3)
CO2: 31 mmol/L (ref 22–32)
CREATININE: 0.95 mg/dL (ref 0.61–1.24)
Chloride: 100 mmol/L — ABNORMAL LOW (ref 101–111)
GFR calc non Af Amer: >60 mL/min (ref >60)
Glucose, Bld: 126 mg/dL — ABNORMAL HIGH (ref 65–99)
Potassium: 4 mmol/L (ref 3.5–5.1)
Sodium: 135 mmol/L (ref 135–145)

## 2015-10-04 LAB — SURGICAL PATHOLOGY

## 2015-10-04 MED ORDER — OXYCODONE HCL 5 MG PO TABS: 5.0000 mg | ORAL_TABLET | ORAL | 0 refills | Status: DC | PRN

## 2015-10-04 MED ORDER — ENOXAPARIN SODIUM 40 MG/0.4ML ~~LOC~~ SOLN: 40.0000 mg | SUBCUTANEOUS | 0 refills | Status: DC

## 2015-10-04 MED ORDER — BISACODYL 5 MG PO TBEC: 5.0000 mg | DELAYED_RELEASE_TABLET | Freq: Every day | ORAL | Status: DC | PRN

## 2015-10-04 NOTE — Progress Notes (Signed)
   Subjective: 2 Days Post-Op Procedure(s) (LRB): TOTAL HIP ARTHROPLASTY ANTERIOR APPROACH (Right) Patient reports pain as mild.   Patient is well, and has had no acute complaints or problems Denies any CP, SOB, ABD pain. We will continue therapy today.  Plan is to go Home after hospital stay.  Objective: Vital signs in last 24 hours: Temp:  [98.2 F (36.8 C)-100.9 F (38.3 C)] 98.2 F (36.8 C) (09/07 0746) Pulse Rate:  [58-84] 84 (09/07 0746) Resp:  [16-19] 18 (09/07 0725) BP: (106-133)/(59-74) 110/59 (09/07 0746) SpO2:  [93 %-97 %] 97 % (09/07 0746)  Intake/Output from previous day: 09/06 0701 - 09/07 0700 In: 840 [P.O.:840] Out: 2000 [Urine:2000] Intake/Output this shift: No intake/output data recorded.   Recent Labs  10/02/15 1207 10/03/15 0420 10/04/15 0331  HGB 13.2 12.5* 11.9*    Recent Labs  10/03/15 0420 10/04/15 0331  WBC 9.9 11.2*  RBC 3.69* 3.57*  HCT 35.0* 34.0*  PLT 205 188    Recent Labs  10/03/15 0420 10/04/15 0331  NA 133* 135  K 3.5 4.0  CL 99* 100*  CO2 28 31  BUN 14 15  CREATININE 1.09 0.95  GLUCOSE 138* 126*  CALCIUM 8.1* 8.3*   No results for input(s): LABPT, INR in the last 72 hours.  EXAM General - Patient is Alert, Appropriate and Oriented Extremity - Neurovascular intact Sensation intact distally Intact pulses distally Dorsiflexion/Plantar flexion intact No cellulitis present Dressing - no drainage Motor Function - intact, moving foot and toes well on exam.   Past Medical History:  Diagnosis Date  . Anxiety   . Arthritis   . Benign prostatic hyperplasia with urinary obstruction and other lower urinary tract symptoms   . Chronic kidney disease    Kidney stones in the past  . GERD (gastroesophageal reflux disease)   . HLD (hyperlipidemia)   . HTN (hypertension)   . Insomnia   . Restless leg syndrome   . Sleep apnea    Use C- PAP    Assessment/Plan:   2 Days Post-Op Procedure(s) (LRB): TOTAL HIP  ARTHROPLASTY ANTERIOR APPROACH (Right) Active Problems:   Primary localized osteoarthritis of right hip   Acute post op blood loss anemia   Estimated body mass index is 31.07 kg/m as calculated from the following:   Height as of this encounter: 6\' 2"  (1.88 m).   Weight as of this encounter: 109.8 kg (242 lb). Advance diet Up with therapy  Needs BM Acute post op blood loss anemia- Hgb stable Encouraged incentive spirometer Plan on discharge to home with HHPT, possibly today pending BM and progress with PT    DVT Prophylaxis - Lovenox, Foot Pumps and TED hose Weight-Bearing as tolerated to right leg   T. Rachelle Hora, PA-C [place] 10/04/2015, 7:53 AM

## 2015-10-04 NOTE — Progress Notes (Signed)
Physical Therapy Treatment Patient Details Name: Roy Berger MRN: [id number] DOB: 17-Mar-1950 Today's Date: 10/04/2015    History of Present Illness Pt. is admitted for a Right THR, anterior approach.    PT Comments    Pt progressing well with increased amb distance to 200' with SBA and RW with min verbal cues for safety/amb closer to RW.  Pt able to ascend/descend 4 steps with one rail and CGA with min verbal cues for sequencing.  Pt SBA with bed mobility tasks with use of rails.  Pt reports no increase in R hip pain from 3/10 baseline during session.  Pt will benefit from continued skilled PT services for increased safety with amb and decreased caregiver assistance.    Follow Up Recommendations  Home health PT     Equipment Recommendations  Rolling walker with 5" wheels (Unsure if pt currently has access to RW)    Recommendations for Other Services       Precautions / Restrictions Precautions Precautions: Fall;Anterior Hip Restrictions Weight Bearing Restrictions: Yes RLE Weight Bearing: Weight bearing as tolerated    Mobility  Bed Mobility Overal bed mobility: Needs Assistance Bed Mobility: Supine to Sit;Sit to Supine     Supine to sit: Supervision Sit to supine: Supervision   General bed mobility comments: Extra time for bed mobility tasks with use of rail  Transfers Overall transfer level: Needs assistance Equipment used: Rolling walker (2 wheeled) Transfers: Sit to/from Stand Sit to Stand: Supervision            Ambulation/Gait Ambulation/Gait assistance: Supervision Ambulation Distance (Feet): 200 Feet Assistive device: Rolling walker (2 wheeled)     Gait velocity interpretation: Below normal speed for age/gender General Gait Details: Progressing towards reciprocal gait but remains antalgic on R with corresponding decreased LLE step length, min verbal cues for amb closer to RW, spouse present for amb training    Stairs Stairs: Yes Stairs  assistance: Min guard Stair Management: One rail Left Number of Stairs: 4 General stair comments: Min verbal cues for sequencing; step training with spouse for proper sequencing  Wheelchair Mobility    Modified Rankin (Stroke Patients Only)       Balance               Standing balance comment: Static and dynamic balance training with feet apart, together, and semi-tandem with combinations of head turns, eyes open/closed, and reaching outside BOS   Single Leg Stance - Left Leg: 3 (No LOB)                Cognition Arousal/Alertness: Awake/alert Behavior During Therapy: WFL for tasks assessed/performed Overall Cognitive Status: Within Functional Limits for tasks assessed                      Exercises Total Joint Exercises Long Arc Quad: Strengthening;Right;Other reps (comment) (2 x 10 with manual resistance) Knee Flexion: Strengthening;Right;Other reps (comment) (2 x 10 with manual resistance) Marching in Standing: AROM;Both;10 reps Other Exercises Other Exercises: Anterior hip precaution review Other Exercises: Seated L hip flex AAROM 2 x 10    General Comments        Pertinent Vitals/Pain Pain Assessment: 0-10 Pain Score: 3  Pain Location: R hip Pain Descriptors / Indicators: Aching (No increase in pain during activity) Pain Intervention(s): Premedicated before session;Limited activity within patient's tolerance    Home Living  Prior Function            PT Goals (current goals can now be found in the care plan section) Progress towards PT goals: Progressing toward goals    Frequency  BID    PT Plan Current plan remains appropriate    Co-evaluation             End of Session Equipment Utilized During Treatment: Gait belt Activity Tolerance: Patient tolerated treatment well Patient left: in chair;with call bell/phone within reach;with chair alarm set;with family/visitor present;with SCD's reapplied      Time: [id number] PT Time Calculation (min) (ACUTE ONLY): 42 min  Charges:  $Gait Training: 8-22 mins $Therapeutic Exercise: 23-37 mins                    G Codes:      DRoyetta Asal PT, DPT 10/04/15, 3:31 PM

## 2015-10-04 NOTE — Progress Notes (Signed)
Physical Therapy Treatment Patient Details Name: Roy Berger MRN: [id number] DOB: 11-16-1950 Today's Date: 10/04/2015    History of Present Illness Pt. is admitted for a Right THR, anterior approach.    PT Comments    Pt progressing well towards goals.  SBA with bed mobility with use of bed rails and extra time and CGA/SBA with transfers.  Pt able to ascend/descend 4 steps with B rails this session with CGA and mod verbal cues for sequencing.  Static and dynamic balance training per below with min instability but no LOB.  Follow Up Recommendations  Home health PT     Equipment Recommendations       Recommendations for Other Services       Precautions / Restrictions Precautions Precautions: Fall;Anterior Hip Restrictions Weight Bearing Restrictions: Yes RLE Weight Bearing: Weight bearing as tolerated    Mobility  Bed Mobility Overal bed mobility: Needs Assistance Bed Mobility: Supine to Sit     Supine to sit: Supervision     General bed mobility comments: Extra time for bed mobility tasks with use of rail  Transfers Overall transfer level: Needs assistance Equipment used: Rolling walker (2 wheeled) Transfers: Sit to/from Stand Sit to Stand: Min guard         General transfer comment: verbal cues for proper hand placements  Ambulation/Gait Ambulation/Gait assistance: Min guard Ambulation Distance (Feet): 120 Feet Assistive device: Rolling walker (2 wheeled) Gait Pattern/deviations: Decreased step length - left   Gait velocity interpretation: Below normal speed for age/gender General Gait Details: Progressing towards reciprocal gait but remains antalgic on R with corresponding decreased LLE step length   Stairs Stairs: Yes Stairs assistance: Min guard Stair Management: Two rails;Step to pattern;Forwards Number of Stairs: 4 General stair comments: Min-mod verbal cues for sequencing  Wheelchair Mobility    Modified Rankin (Stroke Patients Only)        Balance Overall balance assessment: Needs assistance   Sitting balance-Leahy Scale: Good     Standing balance support: No upper extremity supported Standing balance-Leahy Scale: Fair Standing balance comment: Static and dynamic balance training with feet apart and together with combinations of eyes open/closed, head still/head turns, and reaching outside BOS with occasional min instability but no assistance required to prevent LOB                    Cognition Arousal/Alertness: Awake/alert Behavior During Therapy: WFL for tasks assessed/performed Overall Cognitive Status: Within Functional Limits for tasks assessed                      Exercises Total Joint Exercises Ankle Circles/Pumps: AROM;Both;10 reps Quad Sets: AROM;Both;10 reps Gluteal Sets: AROM;Both;10 reps Heel Slides: AROM;Both;5 reps Long Arc Quad: Strengthening;Both;10 reps Knee Flexion: Strengthening;Both;10 reps Marching in Standing: AROM;Both;10 reps Other Exercises Other Exercises: HEP education/review with BLE APs, QS, and GS; sit-to-stand x 5    General Comments        Pertinent Vitals/Pain Pain Assessment: 0-10 Pain Score: 3  Pain Location: R hip Pain Descriptors / Indicators: Aching Pain Intervention(s): Premedicated before session;Limited activity within patient's tolerance    Home Living                      Prior Function            PT Goals (current goals can now be found in the care plan section) Acute Rehab PT Goals Patient Stated Goal: To retrun home PT Goal Formulation:  With patient Progress towards PT goals: Progressing toward goals    Frequency  BID    PT Plan Current plan remains appropriate    Co-evaluation             End of Session Equipment Utilized During Treatment: Gait belt Activity Tolerance: Patient tolerated treatment well Patient left: in chair;with call bell/phone within reach;with chair alarm set (Returned to pt's room soon  after session to re-apply SCDs and found nsg had already done so)     Time: [id number] PT Time Calculation (min) (ACUTE ONLY): 39 min  Charges:  $Gait Training: 8-22 mins $Therapeutic Exercise: 23-37 mins                    G Codes:      DRoyetta Asal PT, DPT 10/04/15, 10:42 AM

## 2015-10-04 NOTE — Discharge Summary (Signed)
Physician Discharge Summary  Patient ID: Roy Berger MRN: [id number] DOB/AGE: [birth year] 67 y.o.  Admit date: 10/02/2015 Discharge date: 10/05/2015 Admission Diagnoses:  primary osteoarthritis   Discharge Diagnoses: Patient Active Problem List   Diagnosis Date Noted  . Primary localized osteoarthritis of right hip 10/02/2015  . HYPERTENSION, BENIGN 03/20/2010  . Fatigue 03/20/2010  . Hyperlipidemia 05/29/2009  . ANXIETY DEPRESSION 05/29/2009  . DYSPNEA 05/29/2009    Past Medical History:  Diagnosis Date  . Anxiety   . Arthritis   . Benign prostatic hyperplasia with urinary obstruction and other lower urinary tract symptoms   . Chronic kidney disease    Kidney stones in the past  . GERD (gastroesophageal reflux disease)   . HLD (hyperlipidemia)   . HTN (hypertension)   . Insomnia   . Restless leg syndrome   . Sleep apnea    Use C- PAP     Transfusion: none   Consultants (if any):   Discharged Condition: Improved  Hospital Course: Roy Berger is an 65 y.o. male who was admitted 10/02/2015 with a diagnosis of <principal problem not specified> and went to the operating room on 10/02/2015 and underwent the above named procedures.    Surgeries: Procedure(s): TOTAL HIP ARTHROPLASTY ANTERIOR APPROACH on 10/02/2015 Patient tolerated the surgery well. Taken to PACU where she was stabilized and then transferred to the orthopedic floor.  Started on Lovenox 40 q 24 hrs. Foot pumps applied bilaterally at 80 mm. Heels elevated on bed with rolled towels. No evidence of DVT. Negative Homan. Physical therapy started on day #1 for gait training and transfer. OT started day #1 for ADL and assisted devices.  Patient's foley was d/c on day #1. Patient's IV was d/c on day #2.  On post op day #3 patient was stable and ready for discharge to home with home health PT.  Implants: Medacta AMIS 5 lateralized stem with S 28 mm head, 58 mm Mpact cup DM with liner  He was given perioperative  antibiotics:  Anti-infectives    Start     Dose/Rate Route Frequency Ordered Stop   10/02/15 1300  ceFAZolin (ANCEF) IVPB 2g/100 mL premix     2 g 200 mL/hr over 30 Minutes Intravenous Every 6 hours 10/02/15 1134 10/03/15 0220   10/02/15 0537  ceFAZolin (ANCEF) 2-4 GM/100ML-% IVPB    Comments:  Phillips Grout: cabinet override      10/02/15 0537 10/02/15 1744   10/02/15 0230  ceFAZolin (ANCEF) IVPB 2g/100 mL premix     2 g 200 mL/hr over 30 Minutes Intravenous  Once 10/02/15 0217 10/02/15 0741    .  He was given sequential compression devices, early ambulation, and lovenox for DVT prophylaxis.  He benefited maximally from the hospital stay and there were no complications.    Recent vital signs:  Vitals:   10/04/15 0746 10/04/15 1028  BP: (!) 110/59   Pulse: 84 78  Resp:    Temp: 98.2 F (36.8 C)     Recent laboratory studies:  Lab Results  Component Value Date   HGB 11.9 (L) 10/04/2015   HGB 12.5 (L) 10/03/2015   HGB 13.2 10/02/2015   Lab Results  Component Value Date   WBC 11.2 (H) 10/04/2015   PLT 188 10/04/2015   Lab Results  Component Value Date   INR 0.96 09/19/2015   Lab Results  Component Value Date   NA 135 10/04/2015   K 4.0 10/04/2015   CL 100 (L) 10/04/2015  CO2 31 10/04/2015   BUN 15 10/04/2015   CREATININE 0.95 10/04/2015   GLUCOSE 126 (H) 10/04/2015    Discharge Medications:     Medication List    STOP taking these medications   aspirin EC 81 MG EC tablet Generic drug:  aspirin     TAKE these medications   atorvastatin 40 MG tablet Commonly known as:  LIPITOR Take 40 mg by mouth every other day. bedtime   OCUVITE EYE HEATLH GUMMIES PO Take 1 capsule by mouth daily.   CENTRUM SILVER ULTRA MENS Tabs Take 1 tablet by mouth daily.   cephALEXin 500 MG capsule Commonly known as:  KEFLEX Take 1 capsule (500 mg total) by mouth 2 (two) times daily.   citalopram 20 MG tablet Commonly known as:  CELEXA Take 20 mg by mouth daily.    enoxaparin 40 MG/0.4ML injection Commonly known as:  LOVENOX Inject 0.4 mLs (40 mg total) into the skin daily.   etodolac 500 MG tablet Commonly known as:  LODINE Take 500 mg by mouth 2 (two) times daily.   hydrochlorothiazide 25 MG tablet Commonly known as:  HYDRODIURIL Take 25 mg by mouth daily.   lisinopril 40 MG tablet Commonly known as:  PRINIVIL,ZESTRIL Take by mouth daily. Now a combination drug with HCTZ   lisinopril-hydrochlorothiazide 20-25 MG tablet Commonly known as:  PRINZIDE,ZESTORETIC Take 1 tablet by mouth daily.   meloxicam 15 MG tablet Commonly known as:  MOBIC Take 15 mg by mouth daily.   omeprazole 20 MG capsule Commonly known as:  PRILOSEC Take 20 mg by mouth every other day.   oxyCODONE 5 MG immediate release tablet Commonly known as:  Oxy IR/ROXICODONE Take 1-2 tablets (5-10 mg total) by mouth every 3 (three) hours as needed for breakthrough pain.   tamsulosin 0.4 MG Caps capsule Commonly known as:  FLOMAX Take 0.4 mg by mouth daily after breakfast.   zolpidem 5 MG tablet Commonly known as:  AMBIEN Take 5 mg by mouth daily as needed.       Diagnostic Studies: Dg Hip Operative Unilat W Or W/o Pelvis Right  Result Date: 10/02/2015 CLINICAL DATA:  Post right hip replacement EXAM: OPERATIVE RIGHT HIP TECHNIQUE: Fluoroscopic spot image(s) were submitted for interpretation post-operatively. COMPARISON:  None. FINDINGS: Single frontal view of the right hip submitted. There is right hip prosthesis with anatomic alignment. I assume is the right hip. Please note the film is un- marked. Fluoroscopy time 42 seconds. IMPRESSION: Right hip prosthesis with anatomic alignment. Please see the operative report. Electronically Signed   By: Lahoma Crocker M.D.   On: 10/02/2015 09:35   Dg Hip Unilat W Or W/o Pelvis 2-3 Views Right  Result Date: 10/02/2015 CLINICAL DATA:  Status post total hip replacement EXAM: DG HIP (WITH OR WITHOUT PELVIS) 2-3V RIGHT COMPARISON:   Intraoperative images obtained earlier in the day FINDINGS: Frontal pelvis and lateral right hip images were obtained. Patient is status post total hip replacement on the right with prosthetic components appearing well-seated. No acute fracture or dislocation. Left hip joint appears unremarkable. IMPRESSION: Right total hip prosthetic components appear well seated. No acute fracture or dislocation. Left hip joint unremarkable. Electronically Signed   By: Lowella Grip III M.D.   On: 10/02/2015 10:26    Disposition: 01-Home or [place], MD Follow up in 2 week(s).   Specialty:  Orthopedic Surgery Why:  for staple removal Contact information: [address]  [place] [address] [phone]            Signed: Feliberto Gottron 10/04/2015, 11:15 AM

## 2015-10-04 NOTE — Discharge Instructions (Signed)

## 2015-10-05 NOTE — Care Management Important Message (Signed)
Important Message  Patient Details  Name: TOLAN MASSINGALE MRN: [id number] Date of Birth: [birth year]   Medicare Important Message Given:  Yes    Katrina Stack, RN 10/05/2015, 12:57 PM

## 2015-10-05 NOTE — Progress Notes (Signed)
Physical Therapy Treatment Patient Details Name: Roy Berger MRN: [id number] DOB: 1950/10/24 Today's Date: 10/05/2015    History of Present Illness Pt. is admitted for a Right THR, anterior approach.    PT Comments    Pt progressing well towards goals.  SBA with amb 200' with RW with grossly improved cadence and LLE step length.  SBA ascending/descending steps with one rail and SBA with transfers.  Pt will benefit from continued PT services to address deficits in strength, gait, mobility, and balance for decreased caregiver assistance.    Follow Up Recommendations  Home health PT     Equipment Recommendations  Rolling walker with 5" wheels    Recommendations for Other Services       Precautions / Restrictions Precautions Precautions: Fall;Anterior Hip Restrictions Weight Bearing Restrictions: Yes RLE Weight Bearing: Weight bearing as tolerated    Mobility  Bed Mobility Overal bed mobility: Modified Independent Bed Mobility: Sit to Supine;Supine to Sit;Rolling Rolling: Modified independent (Device/Increase time)   Supine to sit: Modified independent (Device/Increase time) Sit to supine: Modified independent (Device/Increase time)   General bed mobility comments: Extra time for bed mobility tasks with use of rail  Transfers Overall transfer level: Needs assistance Equipment used: Rolling walker (2 wheeled) Transfers: Sit to/from Stand Sit to Stand: Supervision            Ambulation/Gait Ambulation/Gait assistance: Supervision Ambulation Distance (Feet): 200 Feet Assistive device: Rolling walker (2 wheeled) Gait Pattern/deviations: Decreased step length - left   Gait velocity interpretation: Below normal speed for age/gender General Gait Details: Increased cadence and LLE step length grossly   Stairs   Stairs assistance: Supervision Stair Management: One rail Left Number of Stairs: 4 General stair comments: Pt able to navigate steps without cues  needed for proper sequencing  Wheelchair Mobility    Modified Rankin (Stroke Patients Only)       Balance Overall balance assessment: Needs assistance   Sitting balance-Leahy Scale: Normal       Standing balance-Leahy Scale: Good Standing balance comment: Static and dynamic balance training with feet apart, together, and semi-tandem with combinations of head turns, eyes open/closed, and reaching outside BOS                    Cognition Arousal/Alertness: Awake/alert Behavior During Therapy: WFL for tasks assessed/performed Overall Cognitive Status: Within Functional Limits for tasks assessed                      Exercises Total Joint Exercises Ankle Circles/Pumps: AROM;Both;10 reps Quad Sets: AROM;Both;10 reps Gluteal Sets: AROM;Both;10 reps Other Exercises Other Exercises: HEP education/review to include sit-to-stand x 5 with controlled eccentric phase twice daily Other Exercises: Corner balance exercise education provided to pt and spouse Other Exercises: Bed mobility training with log roll technique to L side of bed Other Exercises: Education regarding activity progression upon discharge    General Comments        Pertinent Vitals/Pain Pain Assessment: 0-10 Pain Score: 2  Pain Location: R hip Pain Descriptors / Indicators: Aching Pain Intervention(s): Premedicated before session    Home Living                      Prior Function            PT Goals (current goals can now be found in the care plan section) Progress towards PT goals: Progressing toward goals    Frequency  BID  PT Plan Current plan remains appropriate    Co-evaluation             End of Session Equipment Utilized During Treatment: Gait belt Activity Tolerance: Patient tolerated treatment well Patient left: in bed;with call bell/phone within reach;with bed alarm set;with SCD's reapplied;with family/visitor present     Time: [id number] PT Time  Calculation (min) (ACUTE ONLY): 38 min  Charges:  $Gait Training: 8-22 mins $Therapeutic Exercise: 8-22 mins $Therapeutic Activity: 8-22 mins                    G Codes:      DRoyetta Asal PT, DPT 10/05/15, 11:38 AM

## 2015-10-05 NOTE — Progress Notes (Signed)
Patient is being discharged to home with home health. IV removed, belongings packed. Wife here to take home.  DC and Rx instructions given and patient acknowledged understanding. Extra honeycomb dressing given to patient.

## 2015-10-05 NOTE — Progress Notes (Signed)
   Subjective: 3 Days Post-Op Procedure(s) (LRB): TOTAL HIP ARTHROPLASTY ANTERIOR APPROACH (Right) Patient reports pain as mild.   Patient is well, and has had no acute complaints or problems Denies any CP, SOB, ABD pain. We will continue therapy today.  Plan is to go Home after hospital stay.  Objective: Vital signs in last 24 hours: Temp:  [97.7 F (36.5 C)-99.2 F (37.3 C)] 97.7 F (36.5 C) (09/08 0740) Pulse Rate:  [51-78] 51 (09/08 0740) Resp:  [16-19] 16 (09/08 0740) BP: (105-130)/(59-69) 117/69 (09/08 0740) SpO2:  [96 %-100 %] 100 % (09/08 0740)  Intake/Output from previous day: 09/07 0701 - 09/08 0700 In: 480 [P.O.:480] Out: 400 [Urine:400] Intake/Output this shift: Total I/O In: -  Out: 250 [Urine:250]   Recent Labs  10/02/15 1207 10/03/15 0420 10/04/15 0331  HGB 13.2 12.5* 11.9*    Recent Labs  10/03/15 0420 10/04/15 0331  WBC 9.9 11.2*  RBC 3.69* 3.57*  HCT 35.0* 34.0*  PLT 205 188    Recent Labs  10/03/15 0420 10/04/15 0331  NA 133* 135  K 3.5 4.0  CL 99* 100*  CO2 28 31  BUN 14 15  CREATININE 1.09 0.95  GLUCOSE 138* 126*  CALCIUM 8.1* 8.3*   No results for input(s): LABPT, INR in the last 72 hours.  EXAM General - Patient is Alert, Appropriate and Oriented Extremity - Neurovascular intact Sensation intact distally Intact pulses distally Dorsiflexion/Plantar flexion intact No cellulitis present Dressing - no drainage Motor Function - intact, moving foot and toes well on exam.   Past Medical History:  Diagnosis Date  . Anxiety   . Arthritis   . Benign prostatic hyperplasia with urinary obstruction and other lower urinary tract symptoms   . Chronic kidney disease    Kidney stones in the past  . GERD (gastroesophageal reflux disease)   . HLD (hyperlipidemia)   . HTN (hypertension)   . Insomnia   . Restless leg syndrome   . Sleep apnea    Use C- PAP    Assessment/Plan:   3 Days Post-Op Procedure(s) (LRB): TOTAL HIP  ARTHROPLASTY ANTERIOR APPROACH (Right) Active Problems:   Primary localized osteoarthritis of right hip   Acute post op blood loss anemia   Estimated body mass index is 31.07 kg/m as calculated from the following:   Height as of this encounter: 6\' 2"  (1.88 m).   Weight as of this encounter: 109.8 kg (242 lb). Advance diet Up with therapy  Plan on discharge to home with HHPT today pending BM    DVT Prophylaxis - Lovenox, Foot Pumps and TED hose Weight-Bearing as tolerated to right leg   T. Rachelle Hora, PA-C [place] 10/05/2015, 8:02 AM

## 2015-10-05 NOTE — Care Management (Signed)
Patient for discharge today.  Confirms he has BSC and Walker.  Confirmed copay for Lovenox and patient has practiced injections.  Notified [facility] of discharge.

## 2015-10-28 DIAGNOSIS — I82409 Acute embolism and thrombosis of unspecified deep veins of unspecified lower extremity: Secondary | ICD-10-CM

## 2015-10-28 DIAGNOSIS — T8172XA Complication of vein following a procedure, not elsewhere classified, initial encounter: Secondary | ICD-10-CM

## 2015-10-28 HISTORY — DX: Complication of vein following a procedure, not elsewhere classified, initial encounter: T81.72XA

## 2015-10-28 HISTORY — DX: Acute embolism and thrombosis of unspecified deep veins of unspecified lower extremity: I82.409

## 2015-11-05 ENCOUNTER — Ambulatory Visit
Admission: RE | Admit: 2015-11-05 | Discharge: 2015-11-05 | Disposition: A | Discharge status: Home / Self Care | Payer: Medicare HMO | Source: Ambulatory Visit | Attending: Orthopedic Surgery | Admitting: Orthopedic Surgery

## 2015-11-05 ENCOUNTER — Other Ambulatory Visit: Payer: Self-pay | Admitting: Orthopedic Surgery

## 2015-11-05 DIAGNOSIS — I82411 Acute embolism and thrombosis of right femoral vein: Secondary | ICD-10-CM | POA: Insufficient documentation

## 2015-11-05 DIAGNOSIS — I82499 Acute embolism and thrombosis of other specified deep vein of unspecified lower extremity: Secondary | ICD-10-CM

## 2015-11-05 DIAGNOSIS — I82431 Acute embolism and thrombosis of right popliteal vein: Secondary | ICD-10-CM | POA: Insufficient documentation

## 2015-11-05 DIAGNOSIS — I82491 Acute embolism and thrombosis of other specified deep vein of right lower extremity: Secondary | ICD-10-CM | POA: Insufficient documentation

## 2015-11-05 DIAGNOSIS — I82441 Acute embolism and thrombosis of right tibial vein: Secondary | ICD-10-CM | POA: Insufficient documentation

## 2016-01-01 ENCOUNTER — Other Ambulatory Visit: Payer: Self-pay | Admitting: Neurological Surgery

## 2016-01-01 DIAGNOSIS — M5412 Radiculopathy, cervical region: Secondary | ICD-10-CM

## 2016-01-11 ENCOUNTER — Ambulatory Visit
Admission: RE | Admit: 2016-01-11 | Discharge: 2016-01-11 | Disposition: A | Discharge status: Home / Self Care | Payer: Medicare HMO | Source: Ambulatory Visit | Attending: Neurological Surgery | Admitting: Neurological Surgery

## 2016-01-11 DIAGNOSIS — M1288 Other specific arthropathies, not elsewhere classified, other specified site: Secondary | ICD-10-CM | POA: Diagnosis not present

## 2016-01-11 DIAGNOSIS — M5412 Radiculopathy, cervical region: Secondary | ICD-10-CM | POA: Diagnosis present

## 2016-01-11 DIAGNOSIS — M4802 Spinal stenosis, cervical region: Secondary | ICD-10-CM | POA: Insufficient documentation

## 2016-01-11 DIAGNOSIS — Z981 Arthrodesis status: Secondary | ICD-10-CM | POA: Diagnosis not present

## 2016-01-15 ENCOUNTER — Other Ambulatory Visit: Payer: Self-pay | Admitting: Neurological Surgery

## 2016-01-15 DIAGNOSIS — M4722 Other spondylosis with radiculopathy, cervical region: Secondary | ICD-10-CM

## 2016-01-15 DIAGNOSIS — Z86718 Personal history of other venous thrombosis and embolism: Secondary | ICD-10-CM

## 2016-01-18 ENCOUNTER — Ambulatory Visit
Admission: RE | Admit: 2016-01-18 | Discharge: 2016-01-18 | Disposition: A | Discharge status: Home / Self Care | Payer: Medicare HMO | Source: Ambulatory Visit | Attending: Neurological Surgery | Admitting: Neurological Surgery

## 2016-01-18 DIAGNOSIS — M4722 Other spondylosis with radiculopathy, cervical region: Secondary | ICD-10-CM | POA: Diagnosis present

## 2016-01-18 DIAGNOSIS — Z981 Arthrodesis status: Secondary | ICD-10-CM | POA: Insufficient documentation

## 2016-01-18 DIAGNOSIS — Z86718 Personal history of other venous thrombosis and embolism: Secondary | ICD-10-CM | POA: Diagnosis not present

## 2016-01-18 DIAGNOSIS — M4802 Spinal stenosis, cervical region: Secondary | ICD-10-CM | POA: Diagnosis not present

## 2016-09-17 NOTE — Progress Notes (Signed)
Cardiology Office Note  Date:  09/18/2016   ID:  Roy, Berger July 13, 1950, MRN [id number]  PCP:  Adin Hector, MD   Chief Complaint  Patient presents with  . other    12 month follow up. Meds reviewed by the pt. verbally. "doing well."     HPI:   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  both in the morning Denies any orthopnea symptoms Heart rate also up and down He denies any significant chest pain concerning for angina He does have shortness of breath with heavy exertion  Prior lab work reviewed with him, total cholesterol 177, LDL 98 He is taking Lipitor 40 mg every other day  EKG personally reviewed by myself on todays visit Shows normal sinus rhythm with rate 81 bpm right bundle branch block, left axis deviation  Other past medical history reviewed echo stress test every 2016, only exercised for approximately 3 minutes with heart rate 144 bpm Was reportedly normal Weight has increased from 247 pounds up to 254 pounds since his last clinic visit  Prior echocardiogram stress test. He exercised for less than 4 minutes, achieved an adequate heart rate with exaggerated blood pressure response to exercise with no significant EKG changes concerning for ischemia, normal LV function with no wall motion abnormalities with exertion, echocardiogram raising the concern of dilated right ventricle and elevated right ventricular systolic pressures (RVSP 43 mm Hg).  he reports losing his wife  several years ago to cancer. he has been on a statin for >30 years.   PMH:   has a past medical history of Anxiety; Arthritis; Benign prostatic hyperplasia with urinary obstruction and other lower urinary tract symptoms;  Chronic kidney disease; GERD (gastroesophageal reflux disease); HLD (hyperlipidemia); HTN (hypertension); Insomnia; Restless leg syndrome; and Sleep apnea.  PSH:    Past Surgical History:  Procedure Laterality Date  . CERVICAL FUSION     Anterior  . COLONOSCOPY WITH PROPOFOL N/A 04/09/2015   Procedure: COLONOSCOPY WITH PROPOFOL;  Surgeon: Josefine Class, MD;  Location: [facility] ENDOSCOPY;  Service: Endoscopy;  Laterality: N/A;  . HERNIA REPAIR    . TOTAL HIP ARTHROPLASTY Right 10/02/2015   Procedure: TOTAL HIP ARTHROPLASTY ANTERIOR APPROACH;  Surgeon: Hessie Knows, MD;  Location: [facility] ORS;  Service: Orthopedics;  Laterality: Right;  . UMBILICAL HERNIA REPAIR      Current Outpatient Prescriptions  Medication Sig Dispense Refill  . aspirin 81 MG tablet Take 81 mg by mouth daily.    Marland Kitchen atorvastatin (LIPITOR) 40 MG tablet Take 40 mg by mouth every other day. bedtime    . citalopram (CELEXA) 20 MG tablet Take 20 mg by mouth daily.    Marland Kitchen etodolac (LODINE) 500 MG tablet Take 500 mg by mouth 2 (two) times daily.    Marland Kitchen lisinopril-hydrochlorothiazide (PRINZIDE,ZESTORETIC) 20-25 MG tablet Take 1 tablet by mouth daily.    . meloxicam (MOBIC) 15 MG tablet Take 15 mg by mouth daily.    . Multiple Vitamins-Minerals (CENTRUM SILVER ULTRA MENS) TABS Take 1 tablet by mouth daily.      . Multiple Vitamins-Minerals (OCUVITE EYE HEATLH GUMMIES PO) Take 1 capsule by mouth daily.    Marland Kitchen omeprazole (PRILOSEC) 20 MG capsule Take 20  mg by mouth every other day.     . tamsulosin (FLOMAX) 0.4 MG CAPS capsule Take 0.4 mg by mouth daily after breakfast.     . zolpidem (AMBIEN) 5 MG tablet Take 5 mg by mouth daily as needed.       No current facility-administered medications for this visit.      Allergies:   Patient has no known allergies.   Social History:  The patient  reports that he has never smoked. He has never used smokeless tobacco. He reports that he drinks alcohol. He reports that he does not use drugs.    Family History:   family history includes Rheum arthritis in his sister.    Review of Systems: Review of Systems  Constitutional:       Weight gain  Respiratory: Negative.   Cardiovascular: Negative.   Gastrointestinal: Negative.   Musculoskeletal: Negative.   Neurological: Negative.   Psychiatric/Behavioral: Negative.   All other systems reviewed and are negative.    PHYSICAL EXAM: VS:  BP 122/68 (BP Location: Left Arm, Patient Position: Sitting, Cuff Size: Normal)   Pulse 81   Ht 6\' 2"  (1.88 m)   Wt 259 lb 4 oz (117.6 kg)   BMI 33.29 kg/m  , BMI Body mass index is 33.29 kg/m. GEN: Well nourished, well developed, in no acute distress, obese  HEENT: normal  Neck: no JVD, carotid bruits, or masses Cardiac: RRR; no murmurs, rubs, or gallops,no edema  Respiratory:  clear to auscultation bilaterally, normal work of breathing GI: soft, nontender, nondistended, + BS MS: no deformity or atrophy  Skin: warm and dry, no rash Neuro:  Strength and sensation are intact Psych: euthymic mood, full affect    Recent Labs: 10/04/2015: BUN 15; Creatinine, Ser 0.95; Hemoglobin 11.9; Platelets 188; Potassium 4.0; Sodium 135    Lipid Panel No results found for: CHOL, HDL, LDLCALC, TRIG    Wt Readings from Last 3 Encounters:  09/18/16 259 lb 4 oz (117.6 kg)  10/02/15 242 lb (109.8 kg)  09/19/15 245 lb (111.1 kg)       ASSESSMENT AND PLAN:  Mixed hyperlipidemia Taking Lipitor, total cholesterol around 170 We have encouraged continued exercise, careful diet management in an effort to lose weight.  HYPERTENSION, BENIGN Blood pressure is well controlled on today's visit. No changes made to the medications.  DYSPNEA Previous shortness of breath, felt secondary to conditioning and weight  ANXIETY DEPRESSION Managed by primary care   Total encounter time more than 15 minutes  Greater than 50% was spent in counseling and coordination of care with the  patient    Disposition:   F/U  12 months  No orders of the defined types were placed in this encounter.   Signed, Esmond Plants, M.D., Ph.D. 09/18/2016  [place], [place]

## 2016-09-18 ENCOUNTER — Ambulatory Visit (INDEPENDENT_AMBULATORY_CARE_PROVIDER_SITE_OTHER): Payer: Medicare Other | Admitting: Cardiovascular Disease

## 2016-09-18 ENCOUNTER — Encounter: Payer: Self-pay | Admitting: Cardiovascular Disease

## 2016-09-18 VITALS — BP 122/68 | HR 81 | Ht 74.0 in | Wt 259.2 lb

## 2016-09-18 DIAGNOSIS — F341 Dysthymic disorder: Secondary | ICD-10-CM

## 2016-09-18 DIAGNOSIS — I10 Essential (primary) hypertension: Secondary | ICD-10-CM | POA: Diagnosis not present

## 2016-09-18 DIAGNOSIS — R0602 Shortness of breath: Secondary | ICD-10-CM

## 2016-09-18 DIAGNOSIS — E782 Mixed hyperlipidemia: Secondary | ICD-10-CM | POA: Diagnosis not present

## 2016-09-18 NOTE — Patient Instructions (Signed)

## 2016-12-05 ENCOUNTER — Other Ambulatory Visit: Payer: Self-pay | Admitting: Neurosurgery

## 2016-12-05 DIAGNOSIS — M5412 Radiculopathy, cervical region: Secondary | ICD-10-CM

## 2016-12-12 ENCOUNTER — Other Ambulatory Visit: Payer: Self-pay

## 2016-12-12 ENCOUNTER — Encounter
Admission: RE | Admit: 2016-12-12 | Discharge: 2016-12-12 | Disposition: A | Discharge status: Home / Self Care | Payer: Medicare Other | Source: Ambulatory Visit | Attending: Neurosurgery | Admitting: Neurosurgery

## 2016-12-12 DIAGNOSIS — M4803 Spinal stenosis, cervicothoracic region: Secondary | ICD-10-CM | POA: Diagnosis not present

## 2016-12-12 DIAGNOSIS — Z01818 Encounter for other preprocedural examination: Secondary | ICD-10-CM | POA: Diagnosis present

## 2016-12-12 DIAGNOSIS — M7138 Other bursal cyst, other site: Secondary | ICD-10-CM | POA: Diagnosis not present

## 2016-12-12 DIAGNOSIS — M4312 Spondylolisthesis, cervical region: Secondary | ICD-10-CM | POA: Insufficient documentation

## 2016-12-12 DIAGNOSIS — Z01812 Encounter for preprocedural laboratory examination: Secondary | ICD-10-CM | POA: Diagnosis not present

## 2016-12-12 HISTORY — DX: Complication of vein following a procedure, not elsewhere classified, initial encounter: T81.72XA

## 2016-12-12 HISTORY — DX: Acute embolism and thrombosis of unspecified deep veins of unspecified lower extremity: I82.409

## 2016-12-12 LAB — CBC
HCT: 46.2 % (ref 40.0–52.0)
Hemoglobin: 15.5 g/dL (ref 13.0–18.0)
MCH: 32.4 pg (ref 26.0–34.0)
MCHC: 33.7 g/dL (ref 32.0–36.0)
MCV: 96.4 fL (ref 80.0–100.0)
PLATELETS: 206 10*3/uL (ref 150–440)
RBC: 4.79 MIL/uL (ref 4.40–5.90)
RDW: 13.3 % (ref 11.5–14.5)
WBC: 7.6 10*3/uL (ref 3.8–10.6)

## 2016-12-12 LAB — TYPE AND SCREEN
ABO/RH(D): A POS
ANTIBODY SCREEN: NEGATIVE

## 2016-12-12 LAB — URINALYSIS, ROUTINE W REFLEX MICROSCOPIC
BILIRUBIN URINE: NEGATIVE
GLUCOSE, UA: NEGATIVE mg/dL
HGB URINE DIPSTICK: NEGATIVE
Ketones, ur: NEGATIVE mg/dL
Leukocytes, UA: NEGATIVE
Nitrite: NEGATIVE
PH: 7 (ref 5.0–8.0)
Protein, ur: NEGATIVE mg/dL
SPECIFIC GRAVITY, URINE: 1.021 (ref 1.005–1.030)

## 2016-12-12 LAB — APTT

## 2016-12-12 LAB — SURGICAL PCR SCREEN
MRSA, PCR: NEGATIVE
Staphylococcus aureus: NEGATIVE

## 2016-12-12 LAB — BASIC METABOLIC PANEL
Anion gap: 9 (ref 5–15)
BUN: 22 mg/dL — ABNORMAL HIGH (ref 6–20)
CO2: 25 mmol/L (ref 22–32)
Calcium: 9.1 mg/dL (ref 8.9–10.3)
Chloride: 104 mmol/L (ref 101–111)
Creatinine, Ser: 0.97 mg/dL (ref 0.61–1.24)
GFR calc Af Amer: >60 mL/min (ref >60)
GLUCOSE: 115 mg/dL — AB (ref 65–99)
POTASSIUM: 3.8 mmol/L (ref 3.5–5.1)
Sodium: 138 mmol/L (ref 135–145)

## 2016-12-12 LAB — PROTIME-INR
INR: 0.87
Prothrombin Time: 11.7 seconds (ref 11.4–15.2)

## 2016-12-12 NOTE — Patient Instructions (Signed)
Your procedure is scheduled on: Wednesday, December 24, 2016 Report to Same Day Surgery on the 2nd floor in the [facility]. To find out your arrival time, please call [phone] between 1PM - 3PM on: Tuesday, December 23, 2016  REMEMBER: Instructions that are not followed completely may result in serious medical risk, up to and including death; or upon the discretion of your surgeon and anesthesiologist your surgery may need to be rescheduled.  Do not eat food after midnight the night before your procedure.  No gum chewing or hard candies.  You may however, drink CLEAR liquids up to 2 hours before you are scheduled to arrive at the hospital for your procedure.  Do not drink clear liquids within 2 hours of the start of your surgery.  Clear liquids include: - water  - apple juice without pulp - clear gatorade - black coffee or tea (Do NOT add anything to the coffee or tea) Do NOT drink anything that is not on this list.  No Alcohol for 24 hours before or after surgery.  No Smoking including e-cigarettes for 24 hours prior to surgery. No chewable tobacco products for at least 6 hours prior to surgery. No nicotine patches on the day of surgery.  Notify your doctor if there is any change in your medical condition (cold, fever, infection).  Do not wear jewelry, make-up, hairpins, clips or nail polish.  Do not wear lotions, powders, or perfumes. You may wear deodorant.  Do not shave 48 hours prior to surgery. Men may shave face and neck.  Contacts and dentures may not be worn into surgery.  Do not bring valuables to the hospital. [facility] is not responsible for any belongings or valuables.   TAKE THESE MEDICATIONS THE MORNING OF SURGERY WITH A SIP OF WATER:  1.  CELEXA 2.  OMEPRAZOLE (take one capsule the night before surgery and one capsule the morning of surgery) - helps to prevent nausea after surgery. 3.  TAMSULOSIN  Use CHG Soap as directed on instruction  sheet.  Bring your C-PAP to the hospital with you in case you may have to spend the night.  November 19 STOP ASPIRIN.  November 19:  Stop Anti-inflammatories such as MELOXICAM, Advil, Aleve, Ibuprofen, Motrin, Naproxen, Naprosyn, Goodie powder, or aspirin products. (May take Tylenol or Acetaminophen if needed.)  Stop ANY OVER THE COUNTER supplements until after surgery. (May continue multivitamin.)  If you are being admitted to the hospital overnight, leave your suitcase in the car. After surgery it may be brought to your room.  Please call the number above if you have any questions about these instructions.

## 2016-12-15 ENCOUNTER — Ambulatory Visit
Admission: RE | Admit: 2016-12-15 | Discharge: 2016-12-15 | Disposition: A | Discharge status: Home / Self Care | Payer: Medicare Other | Source: Ambulatory Visit | Attending: Neurosurgery | Admitting: Neurosurgery

## 2016-12-15 DIAGNOSIS — Z01812 Encounter for preprocedural laboratory examination: Secondary | ICD-10-CM | POA: Diagnosis not present

## 2016-12-15 DIAGNOSIS — M5412 Radiculopathy, cervical region: Secondary | ICD-10-CM

## 2016-12-24 ENCOUNTER — Inpatient Hospital Stay: Payer: Medicare Other | Admitting: Anesthesiology

## 2016-12-24 ENCOUNTER — Other Ambulatory Visit: Payer: Self-pay

## 2016-12-24 ENCOUNTER — Inpatient Hospital Stay
Admission: RE | Admit: 2016-12-24 | Discharge: 2016-12-25 | DRG: 042 | Disposition: A | Discharge status: Home / Self Care | Payer: Medicare Other | Source: Ambulatory Visit | Attending: Neurosurgery | Admitting: Neurosurgery

## 2016-12-24 ENCOUNTER — Inpatient Hospital Stay: Payer: Medicare Other

## 2016-12-24 ENCOUNTER — Encounter
Admission: RE | Disposition: A | Discharge status: Home / Self Care | Payer: Self-pay | Source: Ambulatory Visit | Attending: Neurosurgery

## 2016-12-24 DIAGNOSIS — M5412 Radiculopathy, cervical region: Principal | ICD-10-CM | POA: Diagnosis present

## 2016-12-24 DIAGNOSIS — Z6831 Body mass index (BMI) 31.0-31.9, adult: Secondary | ICD-10-CM

## 2016-12-24 DIAGNOSIS — M4802 Spinal stenosis, cervical region: Secondary | ICD-10-CM | POA: Diagnosis present

## 2016-12-24 DIAGNOSIS — I10 Essential (primary) hypertension: Secondary | ICD-10-CM | POA: Diagnosis present

## 2016-12-24 DIAGNOSIS — F418 Other specified anxiety disorders: Secondary | ICD-10-CM | POA: Diagnosis present

## 2016-12-24 DIAGNOSIS — Z7982 Long term (current) use of aspirin: Secondary | ICD-10-CM | POA: Diagnosis not present

## 2016-12-24 DIAGNOSIS — E669 Obesity, unspecified: Secondary | ICD-10-CM | POA: Diagnosis present

## 2016-12-24 DIAGNOSIS — Z79899 Other long term (current) drug therapy: Secondary | ICD-10-CM

## 2016-12-24 DIAGNOSIS — Z96641 Presence of right artificial hip joint: Secondary | ICD-10-CM | POA: Diagnosis present

## 2016-12-24 DIAGNOSIS — N189 Chronic kidney disease, unspecified: Secondary | ICD-10-CM | POA: Diagnosis present

## 2016-12-24 DIAGNOSIS — Z419 Encounter for procedure for purposes other than remedying health state, unspecified: Secondary | ICD-10-CM

## 2016-12-24 HISTORY — PX: POSTERIOR CERVICAL FUSION/FORAMINOTOMY: SHX5038

## 2016-12-24 SURGERY — POSTERIOR CERVICAL FUSION/FORAMINOTOMY LEVEL 2
Anesthesia: General | Site: Spine Cervical | Wound class: Clean

## 2016-12-24 MED ORDER — FENTANYL CITRATE (PF) 100 MCG/2ML IJ SOLN
INTRAMUSCULAR | Status: AC
  Filled 2016-12-24: qty 2

## 2016-12-24 MED ORDER — DEXAMETHASONE SODIUM PHOSPHATE 10 MG/ML IJ SOLN
INTRAMUSCULAR | Status: DC | PRN
  Administered 2016-12-24: 10 mg via INTRAVENOUS

## 2016-12-24 MED ORDER — ACETAMINOPHEN 325 MG PO TABS: 650.0000 mg | ORAL_TABLET | ORAL | Status: DC | PRN

## 2016-12-24 MED ORDER — PHENOL 1.4 % MT LIQD
1.0000 | OROMUCOSAL | Status: DC | PRN
  Filled 2016-12-24: qty 177

## 2016-12-24 MED ORDER — CITALOPRAM HYDROBROMIDE 20 MG PO TABS
20.0000 mg | ORAL_TABLET | Freq: Every day | ORAL | Status: DC
  Administered 2016-12-25: 20 mg via ORAL
  Filled 2016-12-24: qty 1

## 2016-12-24 MED ORDER — LISINOPRIL-HYDROCHLOROTHIAZIDE 20-25 MG PO TABS: 0.5000 | ORAL_TABLET | Freq: Two times a day (BID) | ORAL | Status: DC

## 2016-12-24 MED ORDER — SODIUM CHLORIDE 0.9 % IV SOLN: 250.0000 mL | INTRAVENOUS | Status: DC

## 2016-12-24 MED ORDER — CEFAZOLIN SODIUM-DEXTROSE 2-4 GM/100ML-% IV SOLN: 2.0000 g | Freq: Once | INTRAVENOUS | Status: DC

## 2016-12-24 MED ORDER — ATORVASTATIN CALCIUM 20 MG PO TABS
40.0000 mg | ORAL_TABLET | Freq: Every evening | ORAL | Status: DC
  Administered 2016-12-24: 40 mg via ORAL
  Filled 2016-12-24: qty 2

## 2016-12-24 MED ORDER — SENNOSIDES-DOCUSATE SODIUM 8.6-50 MG PO TABS: 1.0000 | ORAL_TABLET | Freq: Every evening | ORAL | Status: DC | PRN

## 2016-12-24 MED ORDER — DEXTROSE 5 % IV SOLN
500.0000 mg | Freq: Four times a day (QID) | INTRAVENOUS | Status: DC | PRN
  Filled 2016-12-24: qty 5

## 2016-12-24 MED ORDER — BUPIVACAINE LIPOSOME 1.3 % IJ SUSP
INTRAMUSCULAR | Status: AC
  Filled 2016-12-24: qty 20

## 2016-12-24 MED ORDER — ROCURONIUM BROMIDE 100 MG/10ML IV SOLN
INTRAVENOUS | Status: DC | PRN
  Administered 2016-12-24: 50 mg via INTRAVENOUS

## 2016-12-24 MED ORDER — BACITRACIN 50000 UNITS IM SOLR
INTRAMUSCULAR | Status: DC | PRN
  Administered 2016-12-24: 15:00:00

## 2016-12-24 MED ORDER — EPHEDRINE SULFATE 50 MG/ML IJ SOLN
INTRAMUSCULAR | Status: DC | PRN
  Administered 2016-12-24 (×2): 5 mg via INTRAVENOUS
  Administered 2016-12-24 (×3): 10 mg via INTRAVENOUS

## 2016-12-24 MED ORDER — PROPOFOL 10 MG/ML IV BOLUS
INTRAVENOUS | Status: AC
  Filled 2016-12-24: qty 20

## 2016-12-24 MED ORDER — HYDROMORPHONE HCL 1 MG/ML IJ SOLN: 0.5000 mg | INTRAMUSCULAR | Status: DC | PRN

## 2016-12-24 MED ORDER — CEFAZOLIN SODIUM 1 G IJ SOLR
Freq: Once | INTRAMUSCULAR | Status: DC
  Filled 2016-12-24: qty 100

## 2016-12-24 MED ORDER — MIDAZOLAM HCL 2 MG/2ML IJ SOLN
INTRAMUSCULAR | Status: DC | PRN
  Administered 2016-12-24: 2 mg via INTRAVENOUS

## 2016-12-24 MED ORDER — SODIUM CHLORIDE 0.9% FLUSH: 3.0000 mL | Freq: Two times a day (BID) | INTRAVENOUS | Status: DC

## 2016-12-24 MED ORDER — PANTOPRAZOLE SODIUM 40 MG PO TBEC
40.0000 mg | DELAYED_RELEASE_TABLET | Freq: Every day | ORAL | Status: DC
  Administered 2016-12-25: 40 mg via ORAL
  Filled 2016-12-24: qty 1

## 2016-12-24 MED ORDER — ONDANSETRON HCL 4 MG/2ML IJ SOLN: 4.0000 mg | Freq: Four times a day (QID) | INTRAMUSCULAR | Status: DC | PRN

## 2016-12-24 MED ORDER — BUPIVACAINE HCL (PF) 0.5 % IJ SOLN
INTRAMUSCULAR | Status: AC
Start: 2016-12-24 — End: ?
  Filled 2016-12-24: qty 30

## 2016-12-24 MED ORDER — ROCURONIUM BROMIDE 50 MG/5ML IV SOLN
INTRAVENOUS | Status: AC
  Filled 2016-12-24: qty 1

## 2016-12-24 MED ORDER — PHENYLEPHRINE HCL 10 MG/ML IJ SOLN
INTRAMUSCULAR | Status: DC | PRN
  Administered 2016-12-24 (×2): 100 ug via INTRAVENOUS

## 2016-12-24 MED ORDER — GELATIN ABSORBABLE 12-7 MM EX MISC
CUTANEOUS | Status: DC | PRN
  Administered 2016-12-24: 1

## 2016-12-24 MED ORDER — METHOCARBAMOL 500 MG PO TABS: 750.0000 mg | ORAL_TABLET | Freq: Four times a day (QID) | ORAL | Status: DC | PRN

## 2016-12-24 MED ORDER — DEXTROSE 5 % IV SOLN
3.0000 g | Freq: Once | INTRAVENOUS | Status: AC
  Administered 2016-12-24: 3 g via INTRAVENOUS
  Filled 2016-12-24: qty 3

## 2016-12-24 MED ORDER — BUPIVACAINE-EPINEPHRINE (PF) 0.5% -1:200000 IJ SOLN
INTRAMUSCULAR | Status: AC
  Filled 2016-12-24: qty 30

## 2016-12-24 MED ORDER — LIDOCAINE HCL 4 % MT SOLN
OROMUCOSAL | Status: DC | PRN
  Administered 2016-12-24: 4 mL via TOPICAL

## 2016-12-24 MED ORDER — ACETAMINOPHEN 650 MG RE SUPP
650.0000 mg | RECTAL | Status: DC | PRN
Start: 2016-12-24 — End: 2016-12-25

## 2016-12-24 MED ORDER — SEVOFLURANE IN SOLN
RESPIRATORY_TRACT | Status: AC
  Filled 2016-12-24: qty 250

## 2016-12-24 MED ORDER — THROMBIN (RECOMBINANT) 5000 UNITS EX SOLR
CUTANEOUS | Status: DC | PRN
  Administered 2016-12-24: 5000 [IU] via TOPICAL

## 2016-12-24 MED ORDER — PROPOFOL 10 MG/ML IV BOLUS
INTRAVENOUS | Status: DC | PRN
  Administered 2016-12-24: 150 mg via INTRAVENOUS
  Administered 2016-12-24: 50 mg via INTRAVENOUS
  Administered 2016-12-24: 20 mg via INTRAVENOUS

## 2016-12-24 MED ORDER — SODIUM CHLORIDE FLUSH 0.9 % IV SOLN
INTRAVENOUS | Status: AC
  Filled 2016-12-24: qty 30

## 2016-12-24 MED ORDER — ZOLPIDEM TARTRATE 5 MG PO TABS
5.0000 mg | ORAL_TABLET | Freq: Every evening | ORAL | Status: DC | PRN
  Administered 2016-12-24: 5 mg via ORAL
  Filled 2016-12-24: qty 1

## 2016-12-24 MED ORDER — ONDANSETRON HCL 4 MG/2ML IJ SOLN: 4.0000 mg | Freq: Once | INTRAMUSCULAR | Status: DC | PRN

## 2016-12-24 MED ORDER — BUPIVACAINE-EPINEPHRINE (PF) 0.5% -1:200000 IJ SOLN
INTRAMUSCULAR | Status: DC | PRN
  Administered 2016-12-24: 10 mL via PERINEURAL

## 2016-12-24 MED ORDER — SODIUM CHLORIDE 0.9% FLUSH: 3.0000 mL | INTRAVENOUS | Status: DC | PRN

## 2016-12-24 MED ORDER — LACTATED RINGERS IV SOLN
INTRAVENOUS | Status: DC
  Administered 2016-12-24 (×2): via INTRAVENOUS

## 2016-12-24 MED ORDER — BACITRACIN 50000 UNITS IM SOLR
INTRAMUSCULAR | Status: AC
  Filled 2016-12-24: qty 1

## 2016-12-24 MED ORDER — SODIUM CHLORIDE 0.9% FLUSH
INTRAVENOUS | Status: DC | PRN
  Administered 2016-12-24: 10 mL via INTRAVENOUS

## 2016-12-24 MED ORDER — TAMSULOSIN HCL 0.4 MG PO CAPS
0.4000 mg | ORAL_CAPSULE | Freq: Every day | ORAL | Status: DC
  Administered 2016-12-25: 0.4 mg via ORAL
  Filled 2016-12-24: qty 1

## 2016-12-24 MED ORDER — LIDOCAINE HCL (CARDIAC) 20 MG/ML IV SOLN
INTRAVENOUS | Status: DC | PRN
  Administered 2016-12-24: 100 mg via INTRAVENOUS

## 2016-12-24 MED ORDER — LIDOCAINE HCL (PF) 2 % IJ SOLN
INTRAMUSCULAR | Status: AC
  Filled 2016-12-24: qty 10

## 2016-12-24 MED ORDER — OXYCODONE HCL 5 MG PO TABS: 10.0000 mg | ORAL_TABLET | ORAL | Status: DC | PRN

## 2016-12-24 MED ORDER — ONDANSETRON HCL 4 MG PO TABS: 4.0000 mg | ORAL_TABLET | Freq: Four times a day (QID) | ORAL | Status: DC | PRN

## 2016-12-24 MED ORDER — SUGAMMADEX SODIUM 200 MG/2ML IV SOLN
INTRAVENOUS | Status: AC
  Filled 2016-12-24: qty 2

## 2016-12-24 MED ORDER — THROMBIN (RECOMBINANT) 5000 UNITS EX SOLR
CUTANEOUS | Status: AC
  Filled 2016-12-24: qty 5000

## 2016-12-24 MED ORDER — LISINOPRIL 10 MG PO TABS
10.0000 mg | ORAL_TABLET | Freq: Two times a day (BID) | ORAL | Status: DC
  Administered 2016-12-24: 10 mg via ORAL
  Filled 2016-12-24 (×2): qty 1

## 2016-12-24 MED ORDER — ONDANSETRON HCL 4 MG/2ML IJ SOLN
INTRAMUSCULAR | Status: AC
  Filled 2016-12-24: qty 2

## 2016-12-24 MED ORDER — GELATIN ABSORBABLE 12-7 MM EX MISC
CUTANEOUS | Status: AC
  Filled 2016-12-24: qty 1

## 2016-12-24 MED ORDER — SODIUM CHLORIDE 0.9 % IV SOLN
INTRAVENOUS | Status: DC
  Administered 2016-12-24: 17:00:00 via INTRAVENOUS

## 2016-12-24 MED ORDER — MENTHOL 3 MG MT LOZG
1.0000 | LOZENGE | OROMUCOSAL | Status: DC | PRN
  Filled 2016-12-24: qty 9

## 2016-12-24 MED ORDER — MIDAZOLAM HCL 2 MG/2ML IJ SOLN
INTRAMUSCULAR | Status: AC
  Filled 2016-12-24: qty 2

## 2016-12-24 MED ORDER — OXYCODONE HCL 5 MG PO TABS: 5.0000 mg | ORAL_TABLET | ORAL | Status: DC | PRN

## 2016-12-24 MED ORDER — ACETAMINOPHEN 500 MG PO TABS
1000.0000 mg | ORAL_TABLET | Freq: Four times a day (QID) | ORAL | Status: DC
  Administered 2016-12-24 – 2016-12-25 (×3): 1000 mg via ORAL
  Filled 2016-12-24 (×4): qty 2

## 2016-12-24 MED ORDER — METHYLPREDNISOLONE ACETATE 40 MG/ML IJ SUSP
INTRAMUSCULAR | Status: AC
  Filled 2016-12-24: qty 1

## 2016-12-24 MED ORDER — FENTANYL CITRATE (PF) 100 MCG/2ML IJ SOLN
INTRAMUSCULAR | Status: DC | PRN
Start: 2016-12-24 — End: 2016-12-24
  Administered 2016-12-24 (×2): 100 ug via INTRAVENOUS
  Administered 2016-12-24: 50 ug via INTRAVENOUS
  Administered 2016-12-24: 25 ug via INTRAVENOUS

## 2016-12-24 MED ORDER — FENTANYL CITRATE (PF) 100 MCG/2ML IJ SOLN: 25.0000 ug | INTRAMUSCULAR | Status: DC | PRN

## 2016-12-24 MED ORDER — HYDROCHLOROTHIAZIDE 12.5 MG PO CAPS
12.5000 mg | ORAL_CAPSULE | Freq: Two times a day (BID) | ORAL | Status: DC
  Administered 2016-12-24: 12.5 mg via ORAL
  Filled 2016-12-24 (×2): qty 1

## 2016-12-24 SURGICAL SUPPLY — 60 items
BLADE BOVIE TIP EXT 4 (BLADE) IMPLANT
BLADE SURG 15 STRL LF DISP TIS (BLADE) ×1 IMPLANT
BLADE SURG 15 STRL SS (BLADE) ×2
BULB RESERV EVAC DRAIN JP 100C (MISCELLANEOUS) ×3 IMPLANT
BUR NEURO DRILL SOFT 3.0X3.8M (BURR) ×3 IMPLANT
CANISTER SUCT 1200ML W/VALVE (MISCELLANEOUS) ×6 IMPLANT
CHLORAPREP W/TINT 26ML (MISCELLANEOUS) ×3 IMPLANT
COUNTER NEEDLE 20/40 LG (NEEDLE) ×3 IMPLANT
COVER LIGHT HANDLE STERIS (MISCELLANEOUS) ×6 IMPLANT
CRADLE LAMINECT ARM (MISCELLANEOUS) ×3 IMPLANT
CUP MEDICINE 2OZ PLAST GRAD ST (MISCELLANEOUS) ×6 IMPLANT
DERMABOND ADVANCED (GAUZE/BANDAGES/DRESSINGS) ×2
DERMABOND ADVANCED .7 DNX12 (GAUZE/BANDAGES/DRESSINGS) ×1 IMPLANT
DRAIN CHANNEL JP 10F RND 20C F (MISCELLANEOUS) ×3 IMPLANT
DRAPE C-ARM 42X72 X-RAY (DRAPES) ×6 IMPLANT
DRAPE INCISE IOBAN 66X45 STRL (DRAPES) IMPLANT
DRAPE LAPAROTOMY 100X77 ABD (DRAPES) ×3 IMPLANT
DRAPE MICROSCOPE SPINE 48X150 (DRAPES) ×3 IMPLANT
DRAPE POUCH INSTRU U-SHP 10X18 (DRAPES) ×3 IMPLANT
DRAPE SHEET LG 3/4 BI-LAMINATE (DRAPES) ×3 IMPLANT
DRAPE SURG 17X11 SM STRL (DRAPES) ×12 IMPLANT
DRAPE TABLE BACK 80X90 (DRAPES) ×3 IMPLANT
DRSG OPSITE POSTOP 3X4 (GAUZE/BANDAGES/DRESSINGS) ×3 IMPLANT
ELECT CAUTERY BLADE TIP 2.5 (TIP) ×3
ELECTRODE CAUTERY BLDE TIP 2.5 (TIP) ×1 IMPLANT
FEE INTRAOP MONITOR IMPULS NCS (MISCELLANEOUS) IMPLANT
FRAME EYE SHIELD (PROTECTIVE WEAR) ×6 IMPLANT
GAUZE PETRO XEROFOAM 1X8 (MISCELLANEOUS) ×3 IMPLANT
GAUZE SPONGE 4X4 12PLY STRL (GAUZE/BANDAGES/DRESSINGS) ×3 IMPLANT
GLOVE SURG SYN 8.5  E (GLOVE) ×6
GLOVE SURG SYN 8.5 E (GLOVE) ×3 IMPLANT
GOWN SRG XL LVL 3 NONREINFORCE (GOWNS) ×1 IMPLANT
GOWN STRL NON-REIN TWL XL LVL3 (GOWNS) ×2
GOWN STRL REUS W/ TWL LRG LVL3 (GOWN DISPOSABLE) ×1 IMPLANT
GOWN STRL REUS W/TWL LRG LVL3 (GOWN DISPOSABLE) ×2
GRADUATE 1200CC STRL 31836 (MISCELLANEOUS) ×3 IMPLANT
INTRAOP MONITOR FEE IMPULS NCS (MISCELLANEOUS)
INTRAOP MONITOR FEE IMPULSE (MISCELLANEOUS)
IV CATH ANGIO 12GX3 LT BLUE (NEEDLE) ×3 IMPLANT
KIT RM TURNOVER STRD PROC AR (KITS) ×3 IMPLANT
MARKER SKIN DUAL TIP RULER LAB (MISCELLANEOUS) ×6 IMPLANT
NEEDLE HYPO 22GX1.5 SAFETY (NEEDLE) ×3 IMPLANT
NEEDLE SPNL 18GX3.5 QUINCKE PK (NEEDLE) ×3 IMPLANT
NS IRRIG 1000ML POUR BTL (IV SOLUTION) ×3 IMPLANT
PACK LAMINECTOMY NEURO (CUSTOM PROCEDURE TRAY) ×3 IMPLANT
PIN CASPAR 14 (PIN) ×1 IMPLANT
PIN CASPAR 14MM (PIN) ×3
PIN MAYFIELD SKULL DISP (PIN) ×3 IMPLANT
SPOGE SURGIFLO 8M (HEMOSTASIS) ×2
SPONGE KITTNER 5P (MISCELLANEOUS) IMPLANT
SPONGE SURGIFLO 8M (HEMOSTASIS) ×1 IMPLANT
STAPLER SKIN PROX 35W (STAPLE) ×3 IMPLANT
SUT V-LOC 90 ABS DVC 3-0 CL (SUTURE) ×3 IMPLANT
SUT VIC AB 3-0 SH 8-18 (SUTURE) ×3 IMPLANT
SYR 30ML LL (SYRINGE) ×3 IMPLANT
TAPE CLOTH 3X10 WHT NS LF (GAUZE/BANDAGES/DRESSINGS) ×3 IMPLANT
TOWEL OR 17X26 4PK STRL BLUE (TOWEL DISPOSABLE) ×12 IMPLANT
TRAY FOLEY W/METER SILVER 16FR (SET/KITS/TRAYS/PACK) IMPLANT
TUBING CONNECTING 10 (TUBING) ×2 IMPLANT
TUBING CONNECTING 10' (TUBING) ×1

## 2016-12-24 NOTE — Anesthesia Preprocedure Evaluation (Addendum)
Anesthesia Evaluation  Patient identified by MRN, date of birth, ID band Patient awake    Reviewed: Allergy & Precautions, NPO status , Patient's Chart, lab work & pertinent test results, reviewed documented beta blocker date and time   Airway Mallampati: III  TM Distance: >3 FB     Dental  (+) Chipped   Pulmonary shortness of breath, sleep apnea ,           Cardiovascular hypertension, Pt. on medications      Neuro/Psych PSYCHIATRIC DISORDERS Anxiety Depression    GI/Hepatic GERD  Controlled,  Endo/Other    Renal/GU Renal disease     Musculoskeletal  (+) Arthritis ,   Abdominal   Peds  Hematology   Anesthesia Other Findings Obese.  Patient reports numbness/tingling in left pinky and ring finger, occasional tingling in the right arm and right shoulder pain preop  Reproductive/Obstetrics                            Anesthesia Physical Anesthesia Plan  ASA: III  Anesthesia Plan: General   Post-op Pain Management:    Induction: Intravenous  PONV Risk Score and Plan:   Airway Management Planned: Oral ETT  Additional Equipment:   Intra-op Plan:   Post-operative Plan:   Informed Consent: I have reviewed the patients History and Physical, chart, labs and discussed the procedure including the risks, benefits and alternatives for the proposed anesthesia with the patient or authorized representative who has indicated his/her understanding and acceptance.     Plan Discussed with: CRNA  Anesthesia Plan Comments:         Anesthesia Quick Evaluation

## 2016-12-24 NOTE — Anesthesia Procedure Notes (Signed)
Procedure Name: Intubation Date/Time: 12/24/2016 1:52 PM Performed by: Hedda Slade, CRNA Pre-anesthesia Checklist: Patient identified, Patient being monitored, Timeout performed, Emergency Drugs available and Suction available Patient Re-evaluated:Patient Re-evaluated prior to induction Oxygen Delivery Method: Circle system utilized Preoxygenation: Pre-oxygenation with 100% oxygen Induction Type: IV induction Ventilation: Mask ventilation without difficulty Laryngoscope Size: 4 and McGraph Grade View: Grade I Tube type: Oral Tube size: 7.5 mm Number of attempts: 1 Airway Equipment and Method: Stylet Placement Confirmation: ETT inserted through vocal cords under direct vision,  positive ETCO2 and breath sounds checked- equal and bilateral Secured at: 22 cm Tube secured with: Tape Dental Injury: Teeth and Oropharynx as per pre-operative assessment

## 2016-12-24 NOTE — Anesthesia Post-op Follow-up Note (Signed)
Anesthesia QCDR form completed.        

## 2016-12-24 NOTE — Op Note (Signed)
Indications: Roy Berger is a 66 yo male who presented with bilateral C8 radiculopathies who failed conservative management. He elected for surgical intervention with posterior foraminotomies  Findings: bilateral C7-T1 foraminal stenosis  Preoperative Diagnosis: Cervical Radiculopathy C8 Postoperative Diagnosis: same   EBL: 25 ml IVF: 1000 ml Drains: none Disposition: Extubated and Stable to PACU Complications: none  No foley catheter was placed.   Preoperative Note:   Risks of surgery discussed include: infection, bleeding, stroke, coma, death, paralysis, CSF leak, nerve/spinal cord injury, numbness, tingling, weakness, complex regional pain syndrome, recurrent stenosis and/or disc herniation, vascular injury, development of instability, neck/back pain, need for further surgery, persistent symptoms, development of deformity, and the risks of anesthesia. They understood these risks and have agreed to proceed.  Operative Note:  1. Bilateral laminoforaminotomies at C7-T1 2. Use of the operative microscope for microdissection   The patient was brought to the Operating Room, intubated and turned into the prone position. All pressure points were checked and double checked. Flouroscopy was used to mark the incision. The patient was prepped and draped in the standard fashion. A full timeout was performed. Preoperative antibiotics were given. The incision was injected with local anesthetic.  The incision was opened with a scalpel, then the soft tissues divided with the Bovie. Self-retaining retractors were placed. The paraspinus muscles were reflected laterally in subperiosteal fashion until the T1 transverse processes were visible. Flouroscopy was used to confirm our localization.   The self-retaining retractors were repositioned. The microscope was brought into the field for aid in microdissection. On the left at C7-T1, the drill was used to remove the inferior portion of the left C7 lamina  until the ligamentum flavum was identified.  The ligamentum was carefully dissected free until the dura was exposed.  After identification of the dura, the upgoing curette was used to define the epidural plane.  A 21mm punch was then used to remove the ligamentum flavum.  A nerve hook was used to identify the C7-T1 foramen, then the punch was used to resect the synovial cyst and soft tissue compressing the C8 nerve root.  After this was done, a nerve hook was placed into the foramen to confirm decompression.  At this point, we moved to the right side.  On the left at C7-T1, the drill was used to remove the inferior portion of the right C7 lamina until the ligamentum flavum was identified.  The ligamentum was carefully dissected free until the dura was exposed.  After identification of the dura, the upgoing curette was used to define the epidural plane.  A 66mm punch was then used to remove the ligamentum flavum.  A nerve hook was used to identify the C7-T1 foramen, then the punch was used to resect the synovial cyst and soft tissue compressing the C8 nerve root.  After this was done, a nerve hook was placed into the foramen to confirm decompression.  After decompression, we placed depomedrol over the exposed nerve roots, then obtained hemostasis.  The wound was then irrigated.  The microscope was removed from the field, then we moved towards closure.  After hemostasis, the wound was closed in layers with 0 and 2-0 vicryl. Staples were applied to the incision.  The patient was then flipped supine and extubated with incident. All counts were correct times 2 at the end of the case. No immediate complications were noted.  Roy Olp PA assisted in all portions of the procedure.  Roy Maw MD

## 2016-12-24 NOTE — Progress Notes (Addendum)
Cefazolin 3 g IV once pre-op ordered for surgical prophylaxis per protocol for weight > 110 kg.  Lenis Noon, PharmD 12/24/16 12:34 PM

## 2016-12-24 NOTE — H&P (Signed)
I have reviewed and confirmed my history and physical from 12/03/16 with no additions or changes. Plan for bilateral C7-T1 foraminotomies.  Risks and benefits reviewed.  Heart sounds normal no MRG. Chest Clear to Auscultation Bilaterally.

## 2016-12-24 NOTE — Progress Notes (Signed)
Pt. Brought his cpap unit from home. Unit appears to be functioning properly and is in good repair. No frayed electrical cords or damage noted. Pt. Uses nasal pillows.

## 2016-12-24 NOTE — Transfer of Care (Signed)
Immediate Anesthesia Transfer of Care Note  Patient: Roy Berger  Procedure(s) Performed: POSTERIOR CERVICAL FUSION/FORAMINOTOMY LEVEL 2-C7-T1 (N/A Spine Cervical)  Patient Location: PACU  Anesthesia Type:General  Level of Consciousness: awake, alert  and oriented  Airway & Oxygen Therapy: Patient Spontanous Breathing and Patient connected to face mask oxygen  Post-op Assessment: Report given to RN and Post -op Vital signs reviewed and stable  Post vital signs: Reviewed and stable  Last Vitals:  Vitals:   12/24/16 1234 12/24/16 1614  BP: (!) 144/100 102/68  Pulse: 76 98  Resp: 18 18  Temp: (!) 35.9 C 36.9 C  SpO2: 98% 99%    Last Pain:  Vitals:   12/24/16 1234  TempSrc: Tympanic      Patients Stated Pain Goal: 2 (03/09/13 5208)  Complications: No apparent anesthesia complications

## 2016-12-25 ENCOUNTER — Encounter: Payer: Self-pay | Admitting: Neurosurgery

## 2016-12-25 MED ORDER — OXYCODONE HCL 5 MG PO TABS: 5.0000 mg | ORAL_TABLET | ORAL | 0 refills | Status: AC | PRN

## 2016-12-25 MED ORDER — METHOCARBAMOL 750 MG PO TABS: 750.0000 mg | ORAL_TABLET | Freq: Four times a day (QID) | ORAL | 0 refills | Status: AC | PRN

## 2016-12-25 NOTE — Anesthesia Postprocedure Evaluation (Signed)
Anesthesia Post Note  Patient: Roy Berger  Procedure(s) Performed: POSTERIOR CERVICAL FUSION/FORAMINOTOMY LEVEL 2-C7-T1 (N/A Spine Cervical)  Patient location during evaluation: PACU Anesthesia Type: General Level of consciousness: awake and alert Pain management: pain level controlled Vital Signs Assessment: post-procedure vital signs reviewed and stable Respiratory status: spontaneous breathing, nonlabored ventilation, respiratory function stable and patient connected to nasal cannula oxygen Cardiovascular status: blood pressure returned to baseline and stable Postop Assessment: no apparent nausea or vomiting Anesthetic complications: no     Last Vitals:  Vitals:   12/25/16 0441 12/25/16 0749  BP: 115/74 112/74  Pulse: 62 (!) 52  Resp: 16 16  Temp: 36.9 C (!) 36.3 C  SpO2: 97% 98%    Last Pain:  Vitals:   12/25/16 0749  TempSrc: Oral  PainSc: [place]

## 2016-12-25 NOTE — Progress Notes (Signed)
Pt discharged to home via wheelchair without incident per MD order accompanied by wife. Pt pain controlled on d/c. No change in pt from AM assessment. Prior to d/c, all d/c teachings done both written and verbal. All questions answered. Pt and spouse agree to comply. Pt states he "has a follow up appointment to remove staples". Pt discharged with prescription for oxycodone and pt to pick up RX for Robaxin at [facility] in [place].

## 2016-12-25 NOTE — Care Management (Signed)
Spoke with Marin Olp, PA. She states patient does not need home health PT. Notified Joelene Millin with [facility].

## 2016-12-25 NOTE — Discharge Summary (Signed)
  History: Roy Berger is here s/p posterior foraminotomy C7-T1 for cervical radiculopathy. Patient tolerated procedure without complications. Recovery went as expected without issue. Pain has remained controlled with Tylenol and current pain level is 1/10. Upper extremity symptoms prior to surgery have resolved, except for some right shoulder pain. Denies numbness, tingling, shooting pains in upper or lower extremities. Patient has voided, eaten, and ambulated without issue.   Physical Exam: Vitals:   12/25/16 0441 12/25/16 0749  BP: 115/74 112/74  Pulse: 62 (!) 52  Resp: 16 16  Temp: 98.5 F (36.9 C) (!) 97.3 F (36.3 C)  SpO2: 97% 98%    AA Ox3 CNI Skin: Incision intact. Staples in place. Mild swelling superior aspect with mild tenderness to palpation. No erythema, warmth, or drainage.  Strength:5/5 throughout, sensation intact and symmetric.   Data:  No results for input(s): NA, K, CL, CO2, BUN, CREATININE, LABGLOM, GLUCOSE, CALCIUM in the last 168 hours. No results for input(s): AST, ALT, ALKPHOS in the last 168 hours.  Invalid input(s): TBILI   No results for input(s): WBC, HGB, HCT, PLT in the last 168 hours. No results for input(s): APTT, INR in the last 168 hours.       Assessment/Plan:  Roy Berger is recovering very well s/p posterior foraminotomy for cervical radiculopathy. Pain is adequately controlled with Tylenol, but will continue oxycodone and robaxin for potential breakthrough pain. Patient has voided, ambulated, and eaten without difficulty. Discussed with patient and family wound care and post op activity restrictions. They verbalized understanding. Patient has 2 week follow up appointment scheduled in clinic in 2 weeks for staple removal and to monitor progress. Advised if any questions or concerns arise before then, to please contact office.   Marin Olp PA-C Department of Neurosurgery

## 2016-12-25 NOTE — Plan of Care (Signed)
  Progressing Education: Knowledge of General Education information will improve 12/25/2016 0546 - Progressing by Zidan Helget, Lucille Passy, RN Health Behavior/Discharge Planning: Ability to manage health-related needs will improve 12/25/2016 0546 - Progressing by Lilyona Richner, Lucille Passy, RN Clinical Measurements: Ability to maintain clinical measurements within normal limits will improve 12/25/2016 0546 - Progressing by Shaleena Crusoe, Lucille Passy, RN Will remain free from infection 12/25/2016 0546 - Progressing by Rosalyn Archambault, Lucille Passy, RN Diagnostic test results will improve 12/25/2016 0546 - Progressing by Constancia Geeting, Lucille Passy, RN Respiratory complications will improve 12/25/2016 0546 - Progressing by Bryna Colander, RN Cardiovascular complication will be avoided 12/25/2016 0546 - Progressing by Bryna Colander, RN Activity: Risk for activity intolerance will decrease 12/25/2016 0546 - Progressing by Bryna Colander, RN Nutrition: Adequate nutrition will be maintained 12/25/2016 0546 - Progressing by Bryna Colander, RN Coping: Level of anxiety will decrease 12/25/2016 0546 - Progressing by Bryna Colander, RN Elimination: Will not experience complications related to bowel motility 12/25/2016 0546 - Progressing by Bryna Colander, RN Will not experience complications related to urinary retention 12/25/2016 0546 - Progressing by Hiren Peplinski, Lucille Passy, RN Pain Managment: General experience of comfort will improve 12/25/2016 0546 - Progressing by Uriah Trueba, Lucille Passy, RN Safety: Ability to remain free from injury will improve 12/25/2016 0546 - Progressing by Pernie Grosso, Lucille Passy, RN Skin Integrity: Risk for impaired skin integrity will decrease 12/25/2016 0546 - Progressing by Alsie Younes, Lucille Passy, RN

## 2016-12-25 NOTE — Discharge Instructions (Signed)
Your surgeon has performed an operation on your cervical spine (neck) to relieve pressure on one or more nerves. Many times, patients feel better immediately after surgery and can overdo it. Even if you feel well, it is important that you follow these activity guidelines. If you do not let your back heal properly from the surgery, you can increase the chance of a disc herniation and/or return of your symptoms. The following are instructions to help in your recovery once you have been discharged from the hospital.  * Do not take anti-inflammatory medications for 3 days after surgery (naproxen [Aleve], ibuprofen [Advil, Motrin], celecoxib [Celebrex], etc.)  Activity    No bending, lifting, or twisting (BLT). Avoid lifting objects heavier than 10 pounds (gallon milk jug).  Where possible, avoid household activities that involve lifting, bending, pushing, or pulling such as laundry, vacuuming, grocery shopping, and childcare. Try to arrange for help from friends and family for these activities while your back heals.  Increase physical activity slowly as tolerated.  Taking short walks is encouraged, but avoid strenuous exercise. Do not jog, run, bicycle, lift weights, or participate in any other exercises unless specifically allowed by your doctor. Avoid prolonged sitting, including car rides.  Talk to your doctor before resuming sexual activity.  You should not drive until cleared by your doctor.  Until released by your doctor, you should not return to work or school.  You should rest at home and let your body heal.   You may shower two days after your surgery.  After showering, lightly dab your incision dry. Do not take a tub bath or go swimming for 3 weeks, or until approved by your doctor at your follow-up appointment.  If you smoke, we strongly recommend that you quit.  Smoking has been proven to interfere with normal healing in your back and will dramatically reduce the success rate of your  surgery. Please contact [facility] (800-QUIT-NOW) and use the resources at www.[facility].com for assistance in stopping smoking.  Surgical Incision   If you have a dressing on your incision, you may remove it three days after your surgery. Keep your incision area clean and dry.  If you have staples or stitches on your incision, you should have a follow up scheduled for removal. If you do not have staples or stitches, you will have steri-strips (small pieces of surgical tape) or Dermabond glue. The steri-strips/glue should begin to peel away within about a week (it is fine if the steri-strips fall off before then). If the strips are still in place one week after your surgery, you may gently remove them.  Diet            You may return to your usual diet. Be sure to stay hydrated.  When to Contact us  Although your surgery and recovery will likely be uneventful, you may have some residual numbness, aches, and pains in your arms. This is normal and should improve in the next few weeks.  However, should you experience any of the following, contact us immediately:  New numbness or weakness  Pain that is progressively getting worse, and is not relieved by your pain medications or rest  Bleeding, redness, swelling, pain, or drainage from surgical incision  Chills or flu-like symptoms  Fever greater than 101.0 F (38.3 C)  Problems with bowel or bladder functions  Difficulty breathing or shortness of breath  Warmth, tenderness, or swelling in your calf  Contact Information  During office hours (Monday-Friday 9 am to  5 pm), please call your physician at [phone]  After hours and weekends, please call the [place] Operator at [phone] and ask for the Neurosurgery Resident On Call   For a life-threatening emergency, call 911

## 2018-04-08 ENCOUNTER — Other Ambulatory Visit
Admission: RE | Admit: 2018-04-08 | Discharge: 2018-04-08 | Disposition: A | Discharge status: Home / Self Care | Payer: Medicare Other | Source: Ambulatory Visit | Attending: Internal Medicine | Admitting: Internal Medicine

## 2018-04-08 DIAGNOSIS — R197 Diarrhea, unspecified: Secondary | ICD-10-CM | POA: Insufficient documentation

## 2018-04-08 LAB — GASTROINTESTINAL PANEL BY PCR, STOOL (REPLACES STOOL CULTURE)

## 2018-07-05 ENCOUNTER — Other Ambulatory Visit: Payer: Self-pay | Admitting: Rheumatology

## 2018-07-05 ENCOUNTER — Other Ambulatory Visit (HOSPITAL_COMMUNITY): Payer: Self-pay | Admitting: Rheumatology

## 2018-07-05 DIAGNOSIS — M546 Pain in thoracic spine: Secondary | ICD-10-CM

## 2018-07-07 ENCOUNTER — Ambulatory Visit
Admission: RE | Admit: 2018-07-07 | Discharge: 2018-07-07 | Disposition: A | Discharge status: Home / Self Care | Payer: Medicare Other | Source: Ambulatory Visit | Attending: Rheumatology | Admitting: Rheumatology

## 2018-07-07 ENCOUNTER — Other Ambulatory Visit: Payer: Self-pay

## 2018-07-07 DIAGNOSIS — M546 Pain in thoracic spine: Secondary | ICD-10-CM | POA: Diagnosis present

## 2019-03-06 IMAGING — MR MR CERVICAL SPINE W/O CM
5 series · 34 of 48 positions shown · non-contrast
Comparison: CT scan dated 01/18/2016 and MRI dated 01/11/2016

CLINICAL DATA: Cervical radiculopathy at C8. Neck pain and left arm
pain. Previous cervical spine surgery.

EXAM:
MRI CERVICAL SPINE WITHOUT CONTRAST
TECHNIQUE: Multiplanar, multisequence MR imaging of the cervical spine was
performed. No intravenous contrast was administered.

[Series 2: T2 · sagittal · 3.0mm · 0.56mm/px · 8 of 15 slices shown (1 of 2)]
[im 1/15]
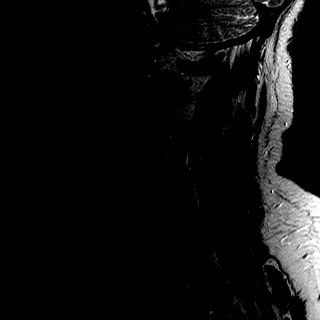
[im 3/15]
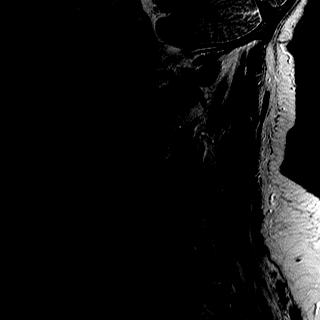
[im 5/15]
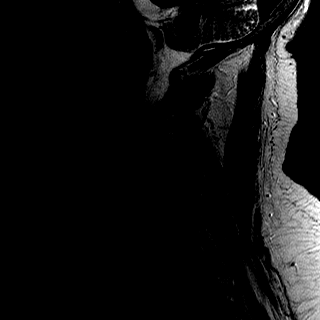
[im 7/15]
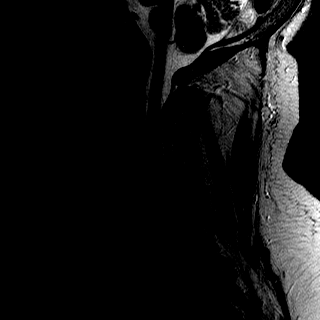
[im 9/15]
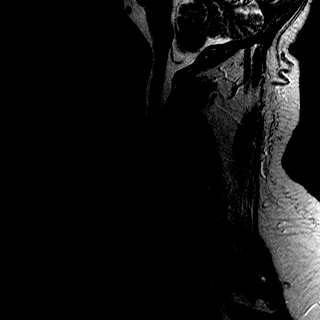
[im 11/15]
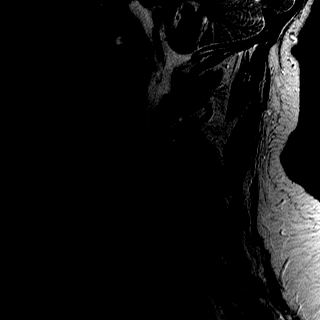
[im 13/15]
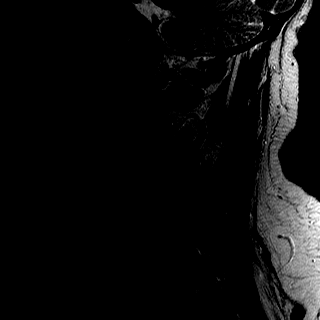
[im 15/15]
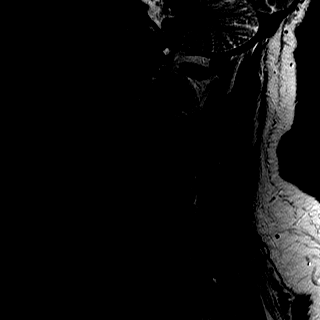

[Series 3: T1 · sagittal · 3.0mm · 0.70mm/px · 7 of 15 slices shown]
[im 1/15]
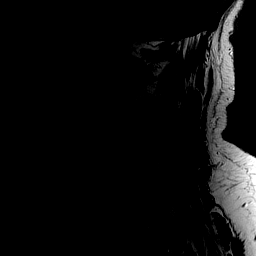
[im 3/15]
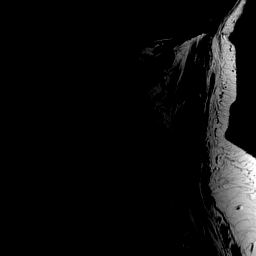
[im 5/15]
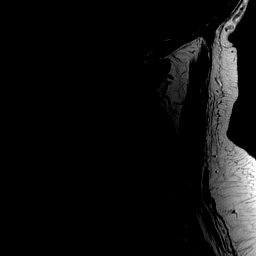
[im 8/15]
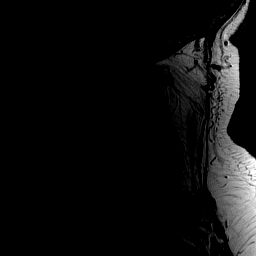
[im 10/15]
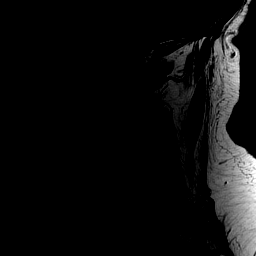
[im 12/15]
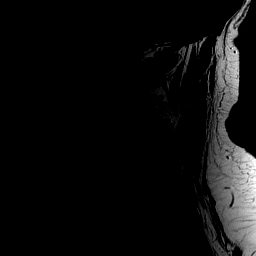
[im 15/15]
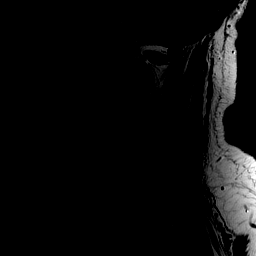

[Series 4: STIR · sagittal · 3.0mm · 0.35mm/px · 7 of 15 slices shown]
[im 1/15]
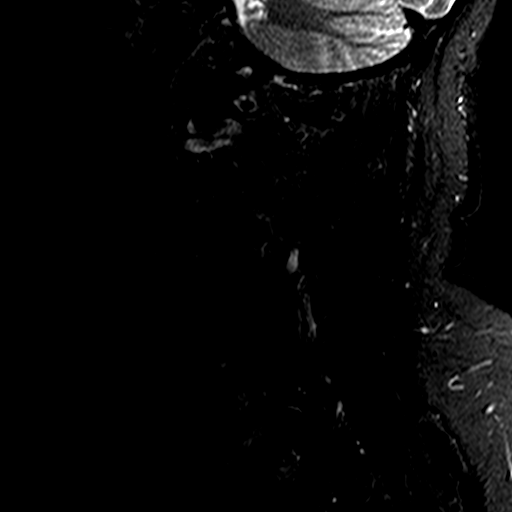
[im 3/15]
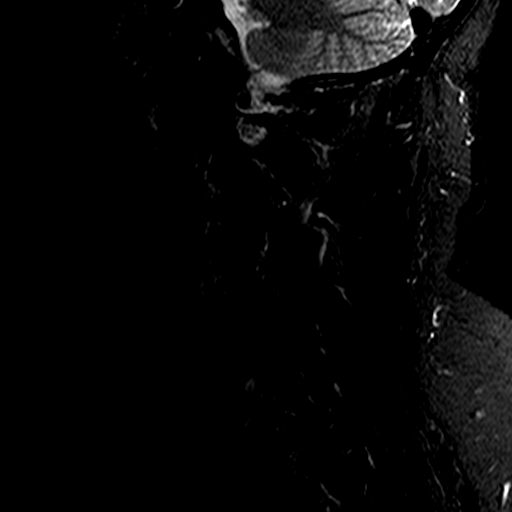
[im 5/15]
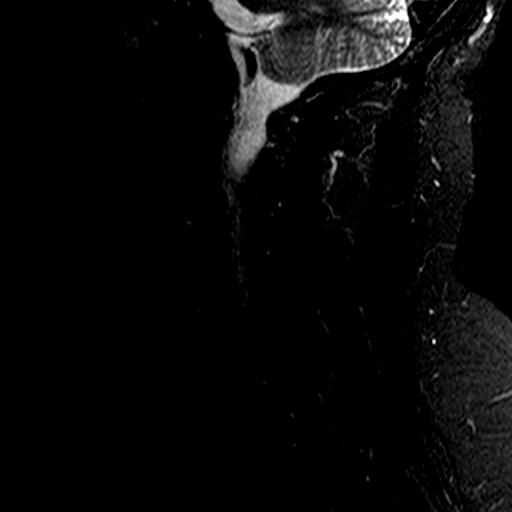
[im 8/15]
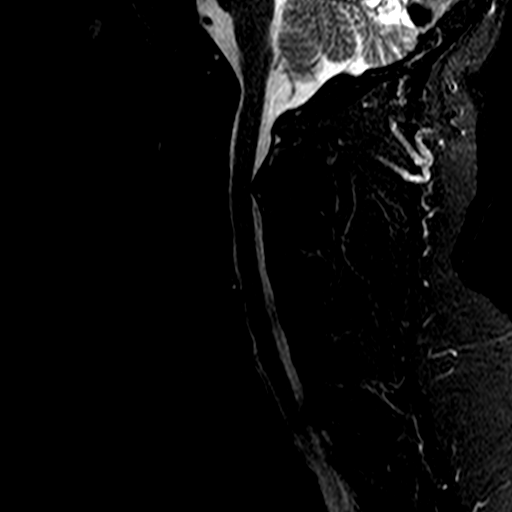
[im 10/15]
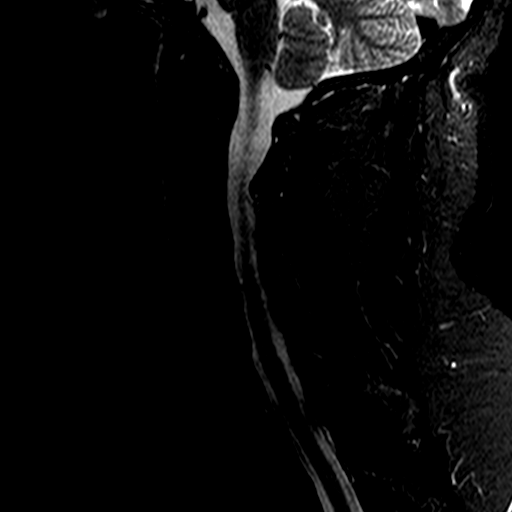
[im 12/15]
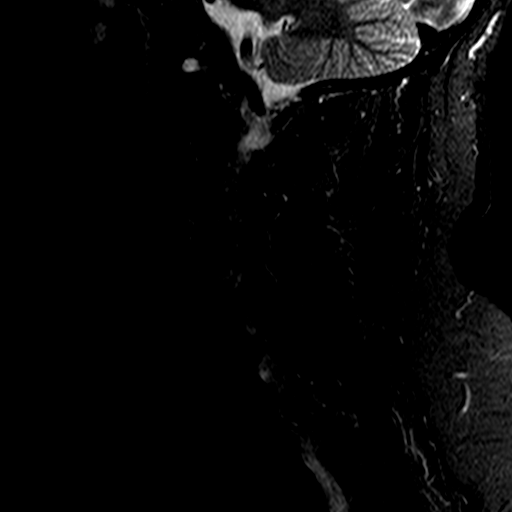
[im 15/15]
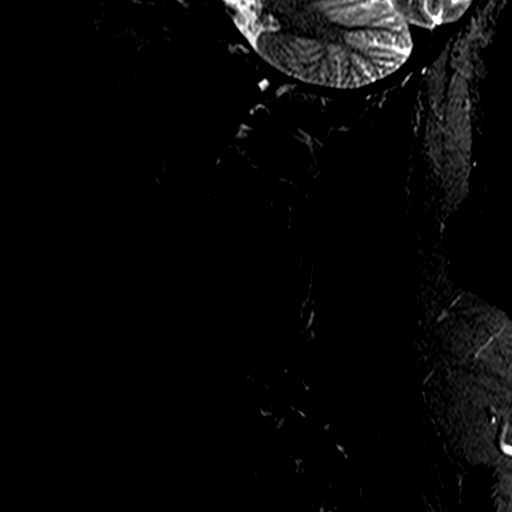

[Series 5: T2 · axial · 3.0mm · 0.70mm/px · z∈[-56,+47]mm · 9 of 28 slices shown (2 of 2)]
[im 1/28]
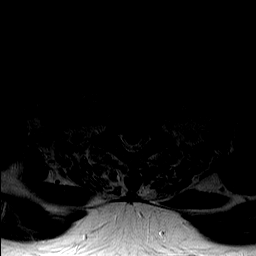
[im 5/28]
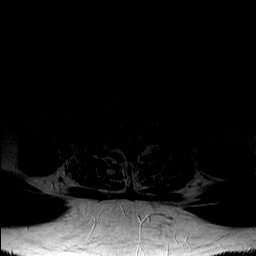
[im 10/28]
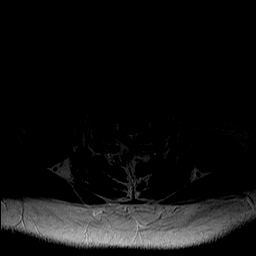
[im 12/28]
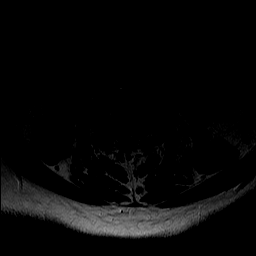
[im 14/28]
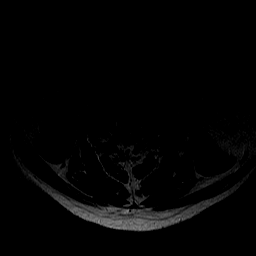
[im 16/28]
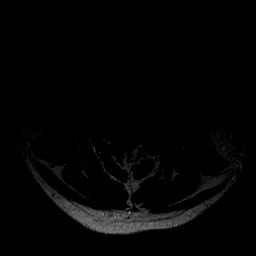
[im 19/28]
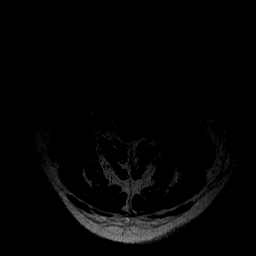
[im 23/28]
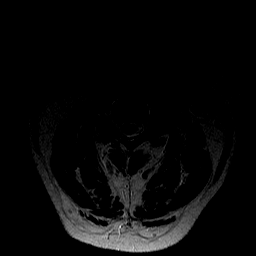
[im 28/28]
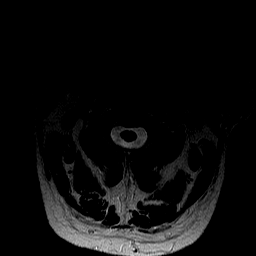

[Series 6: mpgr ax · axial · 3.0mm · 0.35mm/px · z∈[-56,-22]mm · 3 of 28 slices shown]
[im 1/28]
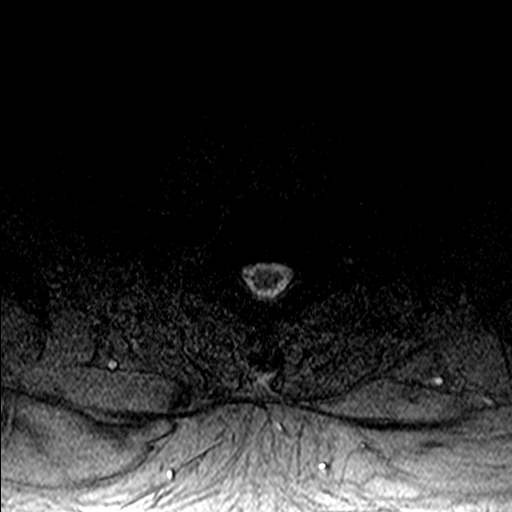
[im 5/28]
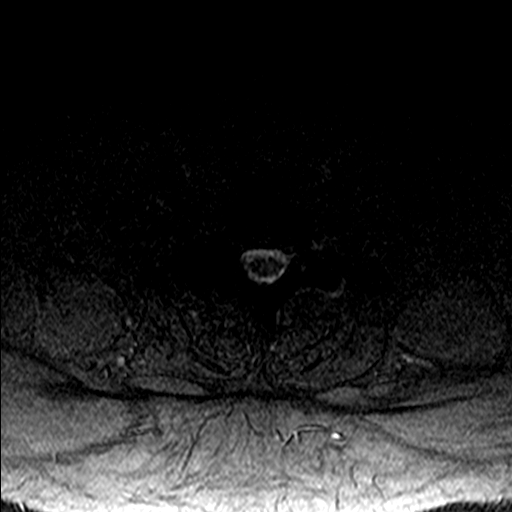
[im 10/28]
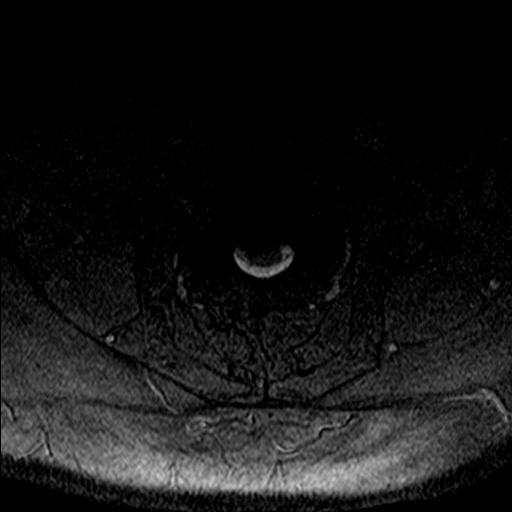

[34 of 48 positions shown; findings below may reference images not displayed]

FINDINGS: Alignment: 2 mm anterolisthesis of C7 on T1.  Otherwise normal.

Vertebrae: Solid fusion from C3 through C7.

Cord: Normal signal and morphology.

Posterior Fossa, vertebral arteries, paraspinal tissues: Negative.

Disc levels:

C2-3: Tiny central disc bulge with no neural impingement. Severe
bilateral facet arthritis. Widely patent neural foramina.

C3-4 through C6-7: Solid fusion of the vertebral bodies and facet
joints. Neural foramina are widely patent bilaterally.

C7-T1: 2 mm anterolisthesis of C7 on T1. Severe bilateral facet
arthritis with bilateral foraminal stenosis. There appears to be a 6
mm synovial cyst arising from the left facet joint extending into
the left neural foramen on image 24 of series 5. This is immediately
adjacent to the left C8 nerve.
IMPRESSION: 1. Synovial cyst arising from the left facet joint at C7-T1
extending into the left neural foramen immediately adjacent to the
left C8 nerve.
2. Severe bilateral foraminal stenosis at C7-T1 due to facet joint
hypertrophy and 2 mm spondylolisthesis.
3. Severe bilateral facet arthritis at C2-3 without neural
impingement.
4. Solid fusion from C3 through C7 with no neural impingement.

## 2020-03-19 ENCOUNTER — Telehealth: Payer: Self-pay | Admitting: Cardiovascular Disease

## 2020-03-19 NOTE — Telephone Encounter (Signed)
Patient moved Deleting recall.

## 2024-01-01 ENCOUNTER — Telehealth: Payer: Self-pay | Admitting: *Deleted

## 2024-01-01 ENCOUNTER — Telehealth: Payer: Self-pay | Admitting: Cardiovascular Disease

## 2024-01-01 NOTE — Telephone Encounter (Signed)
 Left voicemail with Dr. Kevin Krasinski at [facility] spoke with patient and he stated that He has been seen every year by cardiologist. Patient advised to call the office to contact cardiologist regarding cardiac clearance for surgery. Patient was in agreement.

## 2024-01-01 NOTE — Telephone Encounter (Signed)
 Spoke with patient regarding cardiac clearance for surgery. Patient stated that He is in the process of moving and he will be moving to [place], [place] up until the time of his surgery. He also stated that He sees a cardiologist every year and he doesn't understand why he needs to be seen before surgery. I advised him that he hasn't been seen in the office since 2018 and that this is 2025. He will need an office visit before cardiac clearance. He stated that He doesn't see why he needs to be seen, when he sees a cardiologist every year  Dr. Sylvie at [facility] in [place], [place].  I advised him to call the surgeon's office and advise them that he is seen by the cardiologist there every year and have the office to contact his cardiologist there for cardiac clearance. He was in agreement to contact Dr. Collette office.

## 2024-01-01 NOTE — Telephone Encounter (Signed)
   Name: Roy Berger  DOB: [birth year]  MRN: [id number]  Primary Cardiologist:Remotely Dr. Gollan in 2018  Chart reviewed as part of pre-operative protocol coverage. Because of [name]'s past medical history and time since last visit, he will require a follow-up in-office visit in order to better assess preoperative cardiovascular risk. Patient has not seen [facility] since 2018, has appointment scheduled to re-establish care in January. If surgery is not urgent, would wait until that visit, but if surgery is preferred to be expedited, try to move up with MD. He also saw someone with [facility] in 02/2023, unclear if he still plans to follow there too - would ask to confirm he is switching back to [facility].  Pre-op covering staff: - Please schedule appointment and call patient to inform them. If patient already had an upcoming appointment within acceptable timeframe, please add pre-op clearance to the appointment notes so provider is aware. - Please contact requesting surgeon's office via preferred method (i.e, phone, fax) to inform them of need for appointment prior to surgery.  Re: holding meds, we do not have an updated med list. 02/2023 outside cardiology visit references history of atrial fib but does not mention any anticoagulation.  Lourene Hoston N Nyriah Coote, PA-C  01/01/2024, 3:16 PM

## 2024-01-01 NOTE — Telephone Encounter (Signed)
   Pre-operative Risk Assessment    Patient Name: Roy Berger  DOB: [birth year] MRN: [id number]   Date of last office visit: unknown Date of next office visit: 01 05 2025   Request for Surgical Clearance    Procedure:  right shoulder debridement with rotator cuff repair  Date of Surgery:  Clearance 01/11/24                                Surgeon:  Dr. Franky Cranker Surgeon's Group or Practice Name:  [facility] Phone number:  [phone] Fax number:  [phone]   Type of Clearance Requested:   - Medical    Type of Anesthesia:  General    Additional requests/questions:    SignedBerwyn LELON Sprung   01/01/2024, 2:37 PM

## 2024-01-04 ENCOUNTER — Ambulatory Visit

## 2024-01-04 VITALS — BP 128/78 | HR 61 | Ht 74.0 in | Wt 268.0 lb

## 2024-01-04 DIAGNOSIS — I10 Essential (primary) hypertension: Secondary | ICD-10-CM

## 2024-01-04 DIAGNOSIS — I2510 Atherosclerotic heart disease of native coronary artery without angina pectoris: Secondary | ICD-10-CM | POA: Diagnosis not present

## 2024-01-04 DIAGNOSIS — I480 Paroxysmal atrial fibrillation: Secondary | ICD-10-CM

## 2024-01-04 DIAGNOSIS — E782 Mixed hyperlipidemia: Secondary | ICD-10-CM

## 2024-01-04 NOTE — Patient Instructions (Addendum)
 Medication Instructions:  Your physician recommends that you continue on your current medications as directed. Please refer to the Current Medication list given to you today. *If you need a refill on your cardiac medications before your next appointment, please call your pharmacy*  Lab Work: None ordered If you have labs (blood work) drawn today and your tests are completely normal, you will receive your results only by: MyChart Message (if you have MyChart) OR A paper copy in the mail If you have any lab test that is abnormal or we need to change your treatment, we will call you to review the results.  Testing/Procedures: None ordered  Follow-Up: At [facility], you and your health needs are our priority.  As part of our continuing mission to provide you with exceptional heart care, our providers are all part of one team.  This team includes your primary Cardiologist (physician) and Advanced Practice Providers or APPs (Physician Assistants and Nurse Practitioners) who all work together to provide you with the care you need, when you need it.  Your next appointment:    Follow up as scheduled  Provider:   Timothy Gollan, MD    We recommend signing up for the patient portal called MyChart.  Sign up information is provided on this After Visit Summary.  MyChart is used to connect with patients for Virtual Visits (Telemedicine).  Patients are able to view lab/test results, encounter notes, upcoming appointments, etc.  Non-urgent messages can be sent to your provider as well.   To learn more about what you can do with MyChart, go to [email or web address].   Other Instructions YOU ARE CLEARED FOR YOUR UPCOMING PROCEDURE.

## 2024-01-04 NOTE — Progress Notes (Signed)
    Cardiology Office Note Date:  01/04/2024  ID:  Roy Berger, Roy Berger [birth year], MRN [id number] PCP:  Fernande Ophelia JINNY DOUGLAS, MD  Cardiologist:   Joelle VEAR Ren Donley, MD  Chief Complaint  Patient presents with   Atrial Fibrillation      Problems Pre op for shoulder surgery CAC on CT Afib/Aflutter s/p PVI and CTI 2022? Not on Eliquis HTN/HLD M:AN40, LL-HTZ 20-25 Labs 8/25: LDL 119, TG 178  Visits  2/25: no recurrent of atrial fibrillation, off antiarrhythmics and AC    History of Present Illness: Roy Berger is a 73 y.o. male who presents for follow up for pre-op.  He reports doing well overall.  He denies any chest pain or dyspnea with exertion.  He can walk up a flight of stairs and is currently in the process of moving.  He has been moving a lot of boxes without any symptoms.  He has a history of A-fib and had ablation back in 2022 and has been off anticoagulation since then.     ROS: Please see the history of present illness. All other systems are reviewed and negative.   PHYSICAL EXAM: VS:  BP 128/78   Pulse 61   Ht 6' 2 (1.88 m)   Wt 268 lb (121.6 kg)   SpO2 95%   BMI 34.41 kg/m  , BMI Body mass index is 34.41 kg/m. GEN: Well nourished, well developed, in no acute distress HEENT: normal Neck: no JVD, carotid bruits, or masses Cardiac: RRR; no murmurs, rubs, or gallops,no edema  Respiratory:  CTAB bilaterally, normal work of breathing GI: soft, nontender, nondistended, + BS Extremities: No LE edema Skin: warm and dry, no rash Neuro:  Strength and sensation are intact  EKG: NSR w/ RBBB and LAFB  Recent Labs: Reviewed  Studies: Reviewed  ASSESSMENT AND PLAN: Roy Berger is a 73 y.o. male who presents for new visit.   #Pre-op #Afib s/p PVI not on [facility] - Presenting for preop for right shoulder surgery.  Given that he is asymptomatic and can walk up a flight of stairs (METs > 4), no further evaluation warranted and patient can proceed to  surgery. - He will follow-up with Dr. Gollan in [place].   Signed, Joelle VEAR Ren Donley, MD  01/04/2024 9:21 AM    [facility]

## 2024-01-04 NOTE — Telephone Encounter (Signed)
 Tried contacting patient to schedule in office visit no answer left a detailed to call back and schedule new patient appointment for procedure schedule on 01/11/24

## 2024-01-05 NOTE — Telephone Encounter (Signed)
 Attempted to call patient no answer left a vm to call back

## 2024-01-06 NOTE — Telephone Encounter (Signed)
 Left message for requesting office as FYI that we have tried to reach pt to schedule a new pt appt for preop clearance.   I s/w the pt and he saw Dr. Deneise 01/04/24 and has been cleared. I apologized to the pt for my call today.  I said I will re-fax the notes from MD today.

## 2024-02-01 ENCOUNTER — Ambulatory Visit: Admitting: Cardiovascular Disease
